# Patient Record
Sex: Female | Born: 1990 | Race: Asian | Hispanic: No | Marital: Married | Smoking: Never smoker
Health system: Southern US, Community
[De-identification: ages and names within clinical notes are randomized; demographics above are authoritative.]

## PROBLEM LIST (undated history)

## (undated) ENCOUNTER — Inpatient Hospital Stay (HOSPITAL_COMMUNITY): Payer: Self-pay

## (undated) DIAGNOSIS — Z789 Other specified health status: Secondary | ICD-10-CM

## (undated) HISTORY — PX: NO PAST SURGERIES: SHX2092

---

## 2015-01-26 ENCOUNTER — Encounter: Payer: Self-pay | Admitting: Obstetrics & Gynecology

## 2015-01-26 ENCOUNTER — Ambulatory Visit (INDEPENDENT_AMBULATORY_CARE_PROVIDER_SITE_OTHER): Payer: Self-pay

## 2015-01-26 DIAGNOSIS — Z3201 Encounter for pregnancy test, result positive: Secondary | ICD-10-CM

## 2015-01-26 LAB — POCT PREGNANCY, URINE: Preg Test, Ur: POSITIVE — AB

## 2015-01-26 NOTE — Progress Notes (Signed)
Pt here for pregnancy test.  Resulted +.  Pt received proof of pregnancy and will start prenatal care at the [facility].

## 2015-03-16 ENCOUNTER — Inpatient Hospital Stay (HOSPITAL_COMMUNITY)
Admission: AD | Admit: 2015-03-16 | Discharge: 2015-03-16 | Disposition: A | Discharge status: Home / Self Care | Payer: Self-pay | Source: Ambulatory Visit | Attending: Obstetrics & Gynecology | Admitting: Obstetrics & Gynecology

## 2015-03-16 ENCOUNTER — Encounter (HOSPITAL_COMMUNITY): Payer: Self-pay | Admitting: *Deleted

## 2015-03-16 DIAGNOSIS — O99019 Anemia complicating pregnancy, unspecified trimester: Secondary | ICD-10-CM

## 2015-03-16 DIAGNOSIS — O99011 Anemia complicating pregnancy, first trimester: Secondary | ICD-10-CM | POA: Insufficient documentation

## 2015-03-16 DIAGNOSIS — D649 Anemia, unspecified: Secondary | ICD-10-CM | POA: Insufficient documentation

## 2015-03-16 DIAGNOSIS — O99611 Diseases of the digestive system complicating pregnancy, first trimester: Secondary | ICD-10-CM | POA: Insufficient documentation

## 2015-03-16 DIAGNOSIS — Z3A12 12 weeks gestation of pregnancy: Secondary | ICD-10-CM | POA: Insufficient documentation

## 2015-03-16 DIAGNOSIS — K5900 Constipation, unspecified: Secondary | ICD-10-CM | POA: Insufficient documentation

## 2015-03-16 DIAGNOSIS — K219 Gastro-esophageal reflux disease without esophagitis: Secondary | ICD-10-CM | POA: Insufficient documentation

## 2015-03-16 HISTORY — DX: Other specified health status: Z78.9

## 2015-03-16 LAB — COMPREHENSIVE METABOLIC PANEL
ALBUMIN: 3.8 g/dL (ref 3.5–5.0)
ALT: 10 U/L — AB (ref 14–54)
ANION GAP: 4 — AB (ref 5–15)
AST: 17 U/L (ref 15–41)
Alkaline Phosphatase: 49 U/L (ref 38–126)
BUN: 9 mg/dL (ref 6–20)
CO2: 23 mmol/L (ref 22–32)
Calcium: 8.5 mg/dL — ABNORMAL LOW (ref 8.9–10.3)
Chloride: 107 mmol/L (ref 101–111)
Creatinine, Ser: 0.58 mg/dL (ref 0.44–1.00)
GFR calc Af Amer: >60 mL/min (ref >60)
GFR calc non Af Amer: >60 mL/min (ref >60)
GLUCOSE: 84 mg/dL (ref 65–99)
Potassium: 3.6 mmol/L (ref 3.5–5.1)
Sodium: 134 mmol/L — ABNORMAL LOW (ref 135–145)
TOTAL PROTEIN: 7 g/dL (ref 6.5–8.1)
Total Bilirubin: 0.5 mg/dL (ref 0.3–1.2)

## 2015-03-16 LAB — URINE MICROSCOPIC-ADD ON

## 2015-03-16 LAB — CBC
HCT: 31.8 % — ABNORMAL LOW (ref 36.0–46.0)
Hemoglobin: 10.2 g/dL — ABNORMAL LOW (ref 12.0–15.0)
MCH: 20.9 pg — ABNORMAL LOW (ref 26.0–34.0)
MCHC: 32.1 g/dL (ref 30.0–36.0)
MCV: 65 fL — AB (ref 78.0–100.0)
Platelets: 263 10*3/uL (ref 150–400)
RBC: 4.89 MIL/uL (ref 3.87–5.11)
RDW: 22.1 % — ABNORMAL HIGH (ref 11.5–15.5)
WBC: 8.7 10*3/uL (ref 4.0–10.5)

## 2015-03-16 LAB — URINALYSIS, ROUTINE W REFLEX MICROSCOPIC
BILIRUBIN URINE: NEGATIVE
GLUCOSE, UA: NEGATIVE mg/dL
Hgb urine dipstick: NEGATIVE
Ketones, ur: NEGATIVE mg/dL
Nitrite: NEGATIVE
PH: 6 (ref 5.0–8.0)
PROTEIN: NEGATIVE mg/dL
SPECIFIC GRAVITY, URINE: 1.015 (ref 1.005–1.030)
Urobilinogen, UA: 0.2 mg/dL (ref 0.0–1.0)

## 2015-03-16 LAB — AMYLASE: Amylase: 76 U/L (ref 28–100)

## 2015-03-16 LAB — LIPASE, BLOOD: Lipase: 13 U/L — ABNORMAL LOW (ref 22–51)

## 2015-03-16 MED ORDER — FAMOTIDINE 20 MG PO TABS
40.0000 mg | ORAL_TABLET | Freq: Once | ORAL | Status: AC
  Administered 2015-03-16: 40 mg via ORAL
  Filled 2015-03-16: qty 2

## 2015-03-16 NOTE — MAU Note (Signed)
Pain in rt upper quad.  Got worse yesterday, affected her ability to function.  Had had some tenderness prior.

## 2015-03-16 NOTE — MAU Note (Signed)
Going to [facility]

## 2015-03-16 NOTE — Discharge Instructions (Signed)
To bn (Constipation) To bn l khi m?t ng??i ?i ??i ti?n t h?n ba l?n trong m?t tu?n, ??i ti?n kh kh?n, ho?c ??i ti?n ra phn kh, c?ng, ho?c to h?n bnh th??ng. Khi tu?i cng cao th cng d? b? to bn. N?u qu v? c? g?ng ch?a to bn b?ng cc lo?i thu?c gip qu v? ??i ti?n ???c (thu?c nhu?n trng), b?nh c th? n?ng thm. S? d?ng thu?c nhu?n trng trong th?i gian di c th? lm cho cc c? c?a ru?t gi y?u ?i. Ch? ?? ?n thi?u ch?t x?, khng u?ng ?? n??c, v dng m?t s? lo?i thu?c nh?t ??nh c th? lm to bo n?ng thm.  NGUYN NHN.   M?t s? lo?i thu?c nh?t ??nh nh? thu?c ch?ng tr?m c?m, thu?c gi?m ?au, thu?c c b? sung s?t, thu?c trung ha axit d?ch v?, v thu?c l?i ti?u.  M?t s? b?nh l nh?t ??nh nh? ti?u ???ng, h?i ch?ng ru?t kch thch (IBS), b?nh c?a tuy?n gip, ho?c tr?m c?m.  Khng u?ng ?? n??c.  Khng ?n ?? th?c ?n giu ch?t x?.  C?ng th?ng ho?c do ?i l?i.  t ho?t ??ng thn th? ho?c th? d?c.  Nh?n ?i ??i ti?n.  S? d?ng qu nhi?u thu?c nhu?n trng. D?U HI?U V TRI?U CH?NG   ?i ??i ti?n t h?n ba l?n m?i tu?n.  Ph?i r?n m?nh ?? ??i ti?n.  ??i ti?n ra phn c?ng, kh, ho?c to h?n bnh th??ng.  C?m th?y ??y b?ng ho?c ch??ng b?ng.  ?au ? vng b?ng d??i.  Khng c?m th?y tho?i mi sau khi ??i ti?n. CH?N ?ON  Chuyn gia ch?m [place] s?c kh?e c?a qu v? s? h?i v? b?nh s? v khm th?c th? cho qu v?. C th? c?n ki?m tra thm trong tr??ng h?p to bn n?ng. M?t s? ki?m tra c th? bao g?m:  Ch?p X quang c ch?t c?n quang ?? ki?m tra tr?c trng, ??i trng, v ?i khi c? ru?t non c?a qu v?.  N?i soi tr?c trng sigma ?? ki?m tra ph?n pha d??i c?a ??i trng.  Th? thu?t soi ??i trng ?? khm ton b? ??i trng. ?I?U TR?  ?i?u tr? ty thu?c vo m?c ?? tr?m tr?ng c?a ch?ng to bn v nguyn nhn gy to bn. ?i?u tr? thng qua ch? ?? ?n u?ng bao g?m u?ng nhi?u n??c h?n v ?n th?c ?n c nhi?u ch?t x? h?n. ?i?u tr? thng qua l?i s?ng c th? bao g?m vi?c t?p th? d?c th??ng xuyn. N?u  nh?ng khuy?n ngh? v? ch? ?? ?n v l?i s?ng khng c tc d?ng, chuyn gia ch?m [place] s?c kh?e c th? khuy?n ngh? qu v? dng cc lo?i thu?c nhu?n trng khng c?n k ??n ?? gip qu v? ??i ti?n. C th? ph?i k ??n thu?c c?n k ??n n?u thu?c khng c?n k ??n khng c tc d?ng.  H??NG D?N CH?M [place] T?I NH   ?n th?c ?n c nhi?u ch?t x? nh? tri cy, rau, ng? c?c nguyn h?t, v cc lo?i ??u.  H?n ch? th?c ?n c nhi?u ch?t bo v ???ng ch? bi?n s?n, ch?ng h?n khoai ty chin, bnh hamburger, bnh quy, k?o, v soda.  C th? dng th?c ph?m ch?c n?ng c b? sung ch?t x? vo kh?u ph?n ?n c?a qu v? n?u qu v? khng th? dng ?? ch?t x? t? th?c ?n.  U?ng ?? n??c ?? gi? cho n??c ti?u trong ho?c vng nh?t.  T?p th? d?c th??ng xuyn ho?c  theo ch? d?n c?a chuyn gia ch?m [place] s?c kh?e.  Vo nh v? sinh ngay khi qu v? c nhu c?u. Khng nh?n ?i ??i ti?n.  Ch? s? d?ng thu?c khng c?n k ??n ho?c thu?c c?n k ??n theo ch? d?n c?a chuyn gia ch?m [place] s?c kh?e.Khng dng cc lo?i thu?c ch?ng to bn khc m khng bn b?c tr??c v?i chuyn gia ch?m [place] s?c kh?e. NGAY L?P T?C ?I KHM N?U:   ?i ??i ti?n ra mu ?? t??i.  Ch?ng to bn ko di h?n 4 ngy v tr?m tr?ng h?n.  Qu v? b? ?au b?ng ho?c ?au tr?c trng.  Phn c?a qu v? m?ng, trng nh? bt ch.  Qu v? b? s?t cn khng r nguyn nhn. ??M B?O QU V?:   Hi?u r cc h??ng d?n ny.  S? theo di tnh tr?ng c?a mnh.  S? yu c?u tr? gip ngay l?p t?c n?u qu v? c?m th?y khng kh?e ho?c th?y tr?m tr?ng h?n. Document Released: 11/20/2010 Document Revised: 08/10/2013 [facility] Patient Information 2015 [place], [place]. This information is not intended to replace advice given to you by your health care provider. Make sure you discuss any questions you have with your health care provider.  ? nng trong khi mang thai  (Heartburn During Pregnancy ) ? nng l m?t c?m gic nng rt ? ng?c do axit d? dy tro ng??c ln th?c qu?n gy ra. ? nng ph? bi?n trong th?i k?  mang thai v m?t hocmon ? bi?t (progesterone) ???c gi?i phng ra khi ph? n? mang [place]. Hc mn progesterone c th? lm l?ng van ng?n cch th?c qu?n v?i d? dy. ?i?u ny lm cho axit ?i ng??c ln th?c qu?n, gy ? nng. ? nng c?ng c th? x?y ra trong khi mang [place] do t? cung to ra, ??y ng??c ln d? dy, ??y nhi?u axit h?n vo th?c qu?n. ?i?u ny ??c bi?t ?ng trong giai ?o?n cu?i c?a [place] k?. V?n ?? ? nng th??ng bi?n m?t sau khi sinh. NGUYN NHN.  ? nng do axit d? dy tro ng??c ln th?c qu?n gy ra. Trong khi mang thai, vi?c ny c th? do nhi?u nguyn nhn khc nhau, bao g?m:   Hc mn progesterone.  Thay ??i n?ng ?? hc mn.  T? cung pht tri?n, ??y axit d? dy ng??c ln.  B?a ?n no.  M?t s? th?c ph?m v ?? u?ng nh?t ??nh.  T?p luy?n.  T?ng s?n sinh axit. D?U HI?U V TRI?U CH?NG   ?au rt ? ng?c ho?c h?ng d??i.  C?m gic ??ng trong mi?ng.  Ho. CH?N ?ON  Chuyn gia ch?m [place] s?c kh?e s? th??ng ch?n ?on ? nng b?ng cch h?i k? ti?n s? cc v?n ?? lin quan c?a qu vi. C th? c?n xt nghi?m mu ?? ki?m tra m?t lo?i vi khu?n nh?t ??nh c lin quan t?i ? nng. ?i khi, ? nng ???c ch?n ?on b?ng k ??n dng thu?c ?i?u tr? ? nng ?? xem tri?u ch?ng c ???c c?i thi?n khng. Trong m?t s? tr??ng h?p c th? ph?i lm m?t th? thu?t c tn l n?i soi. Trong th? thu?t ny, m?t ?ng c ?n v m?t my ?nh ? ??u (?n n?i soi) ???c s? d?ng ?? ki?m tra th?c qu?n v d? dy. ?I?U TR?  Vi?c ?i?u tr? s? khc nhau ty thu?c vo m?c ?? n?ng c?a cc tri?u ch?ng. Chuyn gia ch?m [place] s?c kh?e c?a qu v? c th? khuy?n ngh?:  S?  d?ng m?t s? thu?c khng c?n k ??n (thu?c trung ha axit d?ch v?, thu?c lm gi?m axit d?ch v?) ?? ?i?u tr? ? nng m?c ?? nh?.  Cc lo?i thu?c k ??n lm gi?m axit d? dy ho?c ?? b?o v? nim m?c d? dy.  M?t s? thay ??i nh?t ??nh trong ch? ?? ?n c?a qu v?.  Nng cao ??u gi??ng c?a qu v? b?ng cch k cc c?c g?ch d??i chn gi??ng. Vi?c ny gip ng?n ng?a a xt trong d? dy khng  tro ng??c ln th?c qu?n khi qu v? n?m. H??NG D?N CH?M [place] T?I NH   Ch? s? d?ng thu?c khng c?n k ??n ho?c thu?c c?n k ??n theo ch? d?n c?a chuyn gia ch?m [place] s?c kh?e.  Nng cao ??u gi??ng c?a qu v? b?ng cch k cc c?c g?ch d??i chn gi??ng n?u chuyn gia ch?m  s?c kh?e yu c?u lm nh? v?y. Dng nhi?u g?i h?n khi ng? l khng hi?u qu? v ?i?u ? ch? lm thay ??i t? th? ??u c?a qu v?.  Khngt?p th? d?c ngay sau khi ?n.  Trnh ?n tr??c khi ?i ng? 2-3 ti?ng. Khng n?m ngay sau khi ?n.  ?n nhi?u b?a nh? trong ngy thay v ?n 3 b?a no.  Xc ??nh cc lo?i th?c ph?m v ?? u?ng lm cho cc tri?u ch?ng t?i t? h?n v trnh dng chng. Cc lo?i th?c ph?m qu v? c th? mu?n trnh dng bao g?m:  H?t tiu.  S c la.  Th?c ?n nhi?u ch?t bo, bao g?m th?c ?n chin, rn.  Th?c ?n [place].  T?i v hnh.  Cc lo?i qu? thu?c gi?ng cam qut, g?m cam, b??i, chanh, v chanh l cam.  Th?c ph?m ch?a c chua ho?c cc s?n ph?m lm t? c chua.  B?c h.  ?? u?ng c ga v caffein.  Gi?m. ?I KHM N?U:  Qu v? b? b?t k? ki?u ?au b?ng no.  Qu v? c?m th?y nng ? b?ng trn ho?c ng?c, ??c bi?t l sau khi ?n ho?c n?m xu?ng.  Qu v? b? bu?n nn v nn m?a.  Qu v? c?m th?y kh ch?u trong d? dy sau khi ?n. NGAY L?P T?C ?I KHM N?U:   Qu v? b? ?au ng?c n?ng lan xu?ng cnh tay ho?c ra hm ho?c c?.  Qu v? c c?m gic ?? m? hi, hoa m?t hay chng m?t.  Qu v? th?y kh th?.  Qu v? nn ra mu.  Qu v? kh nu?t ho?c b? ?au khi nu?t.  Phn c?a qu v? c mu ho?c c mu ?en nh? h?c n.  Qu v? c nh?ng c?n ? nng nhi?u h?n 3 l?n m?i tu?n, trong h?n 2 tu?n. ??M B?O QU V?:  Hi?u r cc h??ng d?n ny.  S? theo di tnh tr?ng c?a mnh.  S? yu c?u tr? gip ngay l?p t?c n?u qu v? c?m th?y khng kh?e ho?c th?y tr?m tr?ng h?n. Document Released: 08/05/2005 Document Revised: 05/26/2013 [facility] Patient Information 2015 [place], [place]. This information is not intended to replace advice  given to you by your health care provider. Make sure you discuss any questions you have with your health care provider.  Thi?u mu do thi?u s?t (Iron Deficiency Anemia) Thi?u mu l m?t tnh tr?ng b?nh l c s? l??ng h?ng c?u ho?c hemoglobin trong mu t h?n bnh th??ng. Hemoglobin l m?t ph?n c?a h?ng c?u mang -xy. Thi?u mu do thi?u s?t l thi?u mu do l??ng s?t  qu t. ?y l lo?i thi?u mu ph? bi?n nh?t. Thi?u mu do thi?u s?t c th? khi?n qu v? m?t m?i v kh th?. NGUYN NHN.   Thi?u s?t trong ch? ?? ?n u?ng.  Kh? n?ng h?p th? s?t km, nh? th?y trong cc r?i lo?n ???ng ru?t.  Ch?y mu ???ng ru?t.  K? kinh nguy?t n?ng. D?U HI?U V TRI?U CH?NG  Thi?u mu nh? c th? khng ???c bi?t ??n. Tri?u ch?ng c th? bao g?m:  M?t m?i.  ?au ??u.  Da nh?t nh?t.  Y?u.  M?t m?i.  Kh th?.  Chng m?t.  L?nh tay v chn.  Nh?p tim nhanh ho?c khng ??u. CH?N ?ON  Ch?n ?on c?n ph?i c chuyn gia ch?m [place] s?c kh?e ?nh gi k? v khm th?c th?Vanessa [place] nghi?m mu th??ng ???c dng ?? xc ??nh thi?u mu do thi?u s?t. Cc xt nghi?m b? sung c th? ???c th?c hi?n ?? tm ra nguyn nhn bn trong c?a tnh tr?ng thi?u mu. Nh?ng xt nghi?m ny c th? bao g?m:  Xt nghi?m tm mu trong phn (xt nghi?m mu ?n trong phn).  M?t th? thu?t ?? xem bn trong ??i trng v tr?c trng (soi ??i trng)  M?t th? thu?t ?? xem bn trong th?c qu?n v d? dy (n?i soi). ?I?U TR?  Thi?u mu do thi?u s?t ???c ?i?u tr? b?ng cch kh?c ph?c nguyn nhn gy thi?u s?t. Vi?c ?i?u tr? c th? bao g?m:  B? sung th?c ?n giu ch?t s?t vo ch? ?? ?n c?a qu v?.  Dng th?c ph?m b? sung s?t. Ph? n? mang thai ho?c cho con b s? c?n b? sung s?t, v ch? ?? ?n u?ng bnh th??ng c?a h? th??ng khng cung c?p ?? s? l??ng yu c?u.  Dng vitamin Vitamin C gip c?i thi?n kh? n?ng h?p thu s?t. Chuyn gia ch?m [place] s?c kh?e c th? [place] qu v? nn u?ng vin s?t v?i m?t ly n??c cam ho?c th?c ph?m ch?c n?ng c b? sung vitamin C.  Cc  lo?i thu?c lm kinh nguy?t ra t mu h?n.  Ph?u thu?t. H??NG D?N CH?M [place] T?I NH   U?ng s?t theo ch? d?n c?a chuyn gia ch?m [place] s?c kh?e.  N?u qu v? khng th? dng th?c ph?m ch?c n?ng c b? sung s?t theo ???ng u?ng, hy ni v?i chuyn gia ch?m [place] s?c kh?e v? vi?c dng thu?c theo ???ng t?nh m?ch (trong t?nh m?ch) ho?c tim vo c?.  ?? h?p thu s?t t?t nh?t, nn s? d?ng cc th?c ph?m ch?c n?ng c b? sung s?t khi ?i. N?u qu v? khng th? dung n?p ???c nh?ng ch?t ny khi b?ng ?i, qu v? c th? c?n s? d?ng chng cng v?i th?c ?n.  Khng u?ng s?a ho?c thu?c lm gi?m a-xt trong d? dy cng m?t lc v?i th?c ph?m ch?c n?ng c b? sung s?t. S?a v thu?c lm gi?m a-xt c th? ?nh h??ng ??n kh? n?ng h?p thu s?t.  Th?c ph?m ch?c n?ng c b? sung s?t c th? gy to bn. B?o ??m vi?c s? d?ng ch?t x? trong ch? ?? ?n ?? trnh to bn. M?t thu?c lm m?m phn c?ng c th? ???c khuyn dng.  U?ng vitamin theo ch? d?n c?a chuyn gia ch?m [place] s?c kh?e.  p d?ng ch? ?? ?n u?ng giu ch?t s?t. Cc lo?i th?c ?n giu ch?t s?t bao g?m gan, th?t b n?c, bnh m nguyn h?t, tr?ng, tri cy kh v rau c mu xanh  s?m, rau c nhi?u l. NGAY L?P T?C ?I KHM N?U:   Qu v? b? ng?t. N?u tnh tr?ng ny x?y ra, Khng li xe. G?i cho d?ch v? c?p c?u t?i ??a ph??ng (911 ? [place]?) n?u khng c s? tr? gip khc.  Qu v? b? ?au ng?c.  Qu v? c?m th?y bu?n nn ho?c nn.  Qu v? b? kh th? n?ng ho?c gia t?ng cng v?i ho?t ??ng.  Qu v? c?m th?y y?u ?t.  Qu v? c nh?p tim nhanh.  Qu v? b? ?? m? hi khng r nguyn nhn.  Qu v? th?y chong vng khi ra kh?i gh? ho?c gi??ng. ??M B?O QU V?:   Hi?u r cc h??ng d?n ny.  S? theo di tnh tr?ng c?a mnh.  S? yu c?u tr? gip ngay l?p t?c n?u qu v? c?m th?y khng kh?e ho?c th?y tr?m tr?ng h?n. Document Released: 08/05/2005 Document Revised: 08/10/2013 [facility] Patient Information 2015 [place], [place]. This information is not intended to replace advice given to you by  your health care provider. Make sure you discuss any questions you have with your health care provider.

## 2015-03-16 NOTE — MAU Provider Note (Signed)
History     CSN: [id number]  Arrival date and time: 03/16/15 8242   First Provider Initiated Contact with Patient 03/16/15 1050      Chief Complaint  Patient presents with  . Abdominal Pain   HPI   Ms. Wendy Gay is a 24 y.o. female G1P0 at 8w4dpresenting to MAU with abdominal pain. The pain started yesterday; the pain is located in in the middle right upper quadrant of her abdomen. The pain comes and goes. She has not taken anything for the pain. Nothing makes the pain worse, however curling up on the couch makes the pain better.   The pain is sharp. She is eating and drinking normally; however when the pain comes she is unable to do anything.   Denies vaginal bleeding.   OB History    Gravida Para Term Preterm AB TAB SAB Ectopic Multiple Living   1         0      Past Medical History  Diagnosis Date  . Medical history non-contributory     Past Surgical History  Procedure Laterality Date  . No past surgeries      History reviewed. No pertinent family history.  History  Substance Use Topics  . Smoking status: Never Smoker   . Smokeless tobacco: Not on file  . Alcohol Use: No    Allergies: No Known Allergies  Prescriptions prior to admission  Medication Sig Dispense Refill Last Dose  . Prenatal Vit-Fe Fumarate-FA (PRENATAL MULTIVITAMIN) TABS tablet Take 1 tablet by mouth daily at 12 noon.   03/15/2015 at Unknown time   Results for orders placed or performed during the hospital encounter of 03/16/15 (from the past 48 hour(s))  Urinalysis, [facility] w reflex [facility]     Status: Abnormal   Collection Time: 03/16/15 10:15 AM  Result Value Ref Range   Color, Urine YELLOW YELLOW   APPearance HAZY (A) CLEAR   Specific Gravity, Urine 1.015 1.005 - 1.030   pH 6.0 5.0 - 8.0   Glucose, UA NEGATIVE NEGATIVE mg/dL   Hgb urine dipstick NEGATIVE NEGATIVE   Bilirubin Urine NEGATIVE NEGATIVE   Ketones, ur NEGATIVE NEGATIVE mg/dL   Protein, ur NEGATIVE  NEGATIVE mg/dL   Urobilinogen, UA 0.2 0.0 - 1.0 mg/dL   Nitrite NEGATIVE NEGATIVE   Leukocytes, UA SMALL (A) NEGATIVE  Urine microscopic-add on     Status: Abnormal   Collection Time: 03/16/15 10:15 AM  Result Value Ref Range   Squamous Epithelial / LPF FEW (A) RARE   WBC, UA 0-2 <3 WBC/hpf   Bacteria, UA FEW (A) RARE  CBC     Status: Abnormal   Collection Time: 03/16/15 11:04 AM  Result Value Ref Range   WBC 8.7 4.0 - 10.5 K/uL   RBC 4.89 3.87 - 5.11 MIL/uL   Hemoglobin 10.2 (L) 12.0 - 15.0 g/dL   HCT 31.8 (L) 36.0 - 46.0 %   MCV 65.0 (L) 78.0 - 100.0 fL   MCH 20.9 (L) 26.0 - 34.0 pg   MCHC 32.1 30.0 - 36.0 g/dL   RDW 22.1 (H) 11.5 - 15.5 %   Platelets 263 150 - 400 K/uL  Comprehensive metabolic panel     Status: Abnormal   Collection Time: 03/16/15 11:04 AM  Result Value Ref Range   Sodium 134 (L) 135 - 145 mmol/L   Potassium 3.6 3.5 - 5.1 mmol/L   Chloride 107 101 - 111 mmol/L   CO2 23 22 -  32 mmol/L   Glucose, Bld 84 65 - 99 mg/dL   BUN 9 6 - 20 mg/dL   Creatinine, Ser 0.58 0.44 - 1.00 mg/dL   Calcium 8.5 (L) 8.9 - 10.3 mg/dL   Total Protein 7.0 6.5 - 8.1 g/dL   Albumin 3.8 3.5 - 5.0 g/dL   AST 17 15 - 41 U/L   ALT 10 (L) 14 - 54 U/L   Alkaline Phosphatase 49 38 - 126 U/L   Total Bilirubin 0.5 0.3 - 1.2 mg/dL   GFR calc non Af Amer >60 >60 mL/min   GFR calc Af Amer >60 >60 mL/min    Comment: (NOTE) The eGFR has been calculated using the CKD EPI equation. This calculation has not been validated in all clinical situations. eGFR's persistently <60 mL/min signify possible Chronic Kidney Disease.    Anion gap 4 (L) 5 - 15  Amylase     Status: None   Collection Time: 03/16/15 11:04 AM  Result Value Ref Range   Amylase 76 28 - 100 U/L  Lipase, blood     Status: Abnormal   Collection Time: 03/16/15 11:04 AM  Result Value Ref Range   Lipase 13 (L) 22 - 51 U/L    Review of Systems  Constitutional: Negative for fever.  Gastrointestinal: Positive for nausea,  abdominal pain and constipation (Hard stools; last normal BM was monday. ). Negative for heartburn, vomiting and diarrhea.  Genitourinary: Negative for dysuria.   Physical Exam   Blood pressure 119/74, pulse 88, temperature 98.9 F (37.2 C), temperature source Oral, resp. rate 16, height 5' 1.25" (1.556 m), weight 51.71 kg (114 lb), last menstrual period 12/18/2014.  Physical Exam  Constitutional: She is oriented to person, place, and time. Vital signs are normal. She appears well-developed and well-nourished.  Non-toxic appearance. She does not have a sickly appearance. She does not appear ill. No distress.  GI: Soft. She exhibits no distension and no mass. There is no tenderness. There is no rebound and no guarding.  Musculoskeletal: Normal range of motion.  Neurological: She is alert and oriented to person, place, and time.  Skin: Skin is warm. She is not diaphoretic.  Psychiatric: Her behavior is normal.    MAU Course  Procedures  MDM  UA  Pepcid 2 tabs given Amylase Lipase CBC CMET  Pain 0/10 at the time of discharge; pepcid relieved her pain   Assessment and Plan   A:  1. Gastroesophageal reflux disease, esophagitis presence not specified   2. Constipation during pregnancy in first trimester   3. Anemia affecting pregnancy    P;  Discharge home in stable condition Ok to take tums/pepcid over the counter as needed, as directed on the bottle Safe med list given with options to use for constipation Over the counter iron as directed on the bottle; to start after constipation is resolved  Return to MAU if symptoms worsen; patient is stable at the time of discharge, labs normal. If persists she may need gall bladder scan.   Lezlie Lye, NP 03/16/2015 10:54 AM

## 2015-03-23 ENCOUNTER — Emergency Department (INDEPENDENT_AMBULATORY_CARE_PROVIDER_SITE_OTHER)
Admission: EM | Admit: 2015-03-23 | Discharge: 2015-03-23 | Disposition: A | Discharge status: Home / Self Care | Payer: Self-pay | Source: Home / Self Care | Attending: Emergency Medicine | Admitting: Emergency Medicine

## 2015-03-23 ENCOUNTER — Encounter (HOSPITAL_COMMUNITY): Payer: Self-pay | Admitting: Emergency Medicine

## 2015-03-23 DIAGNOSIS — R0989 Other specified symptoms and signs involving the circulatory and respiratory systems: Secondary | ICD-10-CM

## 2015-03-23 DIAGNOSIS — R591 Generalized enlarged lymph nodes: Secondary | ICD-10-CM

## 2015-03-23 MED ORDER — AMOXICILLIN 500 MG PO CAPS: 500.0000 mg | ORAL_CAPSULE | Freq: Two times a day (BID) | ORAL | Status: DC

## 2015-03-23 NOTE — Discharge Instructions (Signed)
The bump is a lymph node. They become tender and swollen when there is an infection. Take amoxicillin twice a day for 10 days. This will take weeks to months to go away, even after treating the infection.

## 2015-03-23 NOTE — ED Provider Notes (Signed)
CSN: [id number]     Arrival date & time 03/23/15  1622 History   First MD Initiated Contact with Patient 03/23/15 1830     Chief Complaint  Patient presents with  . Mass   (Consider location/radiation/quality/duration/timing/severity/associated sxs/prior Treatment) HPI She is a 24 year old woman here for evaluation of lump in her neck. She is approximately [redacted] weeks pregnant. She states she noticed a tender lump under her chin a few days ago. No fevers or chills. No sore throat or dental issues. She wanted it evaluated as she is pregnant.  Past Medical History  Diagnosis Date  . Medical history non-contributory    Past Surgical History  Procedure Laterality Date  . No past surgeries     No family history on file. History  Substance Use Topics  . Smoking status: Never Smoker   . Smokeless tobacco: Not on file  . Alcohol Use: No   OB History    Gravida Para Term Preterm AB TAB SAB Ectopic Multiple Living   1         0     Review of Systems As in history of present illness Allergies  Review of patient's allergies indicates no known allergies.  Home Medications   Prior to Admission medications   Medication Sig Start Date End Date Taking? Authorizing Provider  amoxicillin (AMOXIL) 500 MG capsule Take 1 capsule (500 mg total) by mouth 2 (two) times daily. 03/23/15   Charm Rings, MD  Prenatal Vit-Fe Fumarate-FA (PRENATAL MULTIVITAMIN) TABS tablet Take 1 tablet by mouth daily at 12 noon.    Historical Provider, MD   BP 116/72 mmHg  Pulse 75  Temp(Src) 98 F (36.7 C) (Oral)  Resp 18  SpO2 100%  LMP 12/18/2014 (Exact Date) Physical Exam  Constitutional: She is oriented to person, place, and time. She appears well-developed and well-nourished. No distress.  HENT:  Mouth/Throat: Oropharynx is clear and moist.  She has generally poor dentition. No obvious abscess or erythema. She does have a 5-10 mm tender mobile mass under her chin.  Neck: Neck supple.  Cardiovascular: Normal  rate.   Pulmonary/Chest: Effort normal.  Neurological: She is alert and oriented to person, place, and time.    ED Course  Procedures (including critical care time) Labs Review Labs Reviewed - No data to display  Imaging Review No results found.   MDM   1. Tender lymph node    This is likely a lymph node. With her poor dentition, will treat with 10 days of amoxicillin. Follow-up as needed.    Charm Rings, MD 03/23/15 1900

## 2015-04-03 ENCOUNTER — Other Ambulatory Visit (HOSPITAL_COMMUNITY): Payer: Self-pay | Admitting: Nurse Practitioner

## 2015-04-03 DIAGNOSIS — Z3689 Encounter for other specified antenatal screening: Secondary | ICD-10-CM

## 2015-04-03 DIAGNOSIS — Z3A19 19 weeks gestation of pregnancy: Secondary | ICD-10-CM

## 2015-04-03 LAB — OB RESULTS CONSOLE HIV ANTIBODY (ROUTINE TESTING): HIV: NONREACTIVE

## 2015-04-03 LAB — OB RESULTS CONSOLE ABO/RH: RH Type: POSITIVE

## 2015-04-03 LAB — OB RESULTS CONSOLE RPR: RPR: NONREACTIVE

## 2015-04-03 LAB — OB RESULTS CONSOLE RUBELLA ANTIBODY, IGM: Rubella: IMMUNE

## 2015-04-03 LAB — OB RESULTS CONSOLE HEPATITIS B SURFACE ANTIGEN: HEP B S AG: NEGATIVE

## 2015-04-03 LAB — OB RESULTS CONSOLE ANTIBODY SCREEN: Antibody Screen: NEGATIVE

## 2015-04-03 LAB — OB RESULTS CONSOLE GC/CHLAMYDIA
CHLAMYDIA, DNA PROBE: POSITIVE
Gonorrhea: NEGATIVE

## 2015-05-01 LAB — OB RESULTS CONSOLE GC/CHLAMYDIA
CHLAMYDIA, DNA PROBE: NEGATIVE
Gonorrhea: NEGATIVE

## 2015-05-05 ENCOUNTER — Other Ambulatory Visit (HOSPITAL_COMMUNITY): Payer: Self-pay | Admitting: Nurse Practitioner

## 2015-05-05 DIAGNOSIS — Z3689 Encounter for other specified antenatal screening: Secondary | ICD-10-CM

## 2015-05-05 DIAGNOSIS — Z3A20 20 weeks gestation of pregnancy: Secondary | ICD-10-CM

## 2015-05-08 ENCOUNTER — Ambulatory Visit (HOSPITAL_COMMUNITY)
Admission: RE | Admit: 2015-05-08 | Discharge: 2015-05-08 | Disposition: A | Discharge status: Home / Self Care | Payer: Medicaid Other | Source: Ambulatory Visit | Attending: Nurse Practitioner | Admitting: Nurse Practitioner

## 2015-05-08 DIAGNOSIS — Z3A20 20 weeks gestation of pregnancy: Secondary | ICD-10-CM | POA: Insufficient documentation

## 2015-05-08 DIAGNOSIS — Z36 Encounter for antenatal screening of mother: Secondary | ICD-10-CM | POA: Diagnosis present

## 2015-05-08 DIAGNOSIS — Z3689 Encounter for other specified antenatal screening: Secondary | ICD-10-CM

## 2015-08-20 NOTE — L&D Delivery Note (Signed)
Delivery Note Pt pushed well and at 11:31 PM a viable female was delivered via Vaginal, Spontaneous Delivery (Presentation: Right Occiput Anterior).  APGAR: 9, 9; weight: pending . Nuchal cord x 2 fairly tight; delivered thru using 'somersault maneuver.' Infant dried and placed on pt's abd. Cord clamped and cut by mother of pt. Hospital cord blood sample collected.  Placenta status: Intact, Spontaneous.  Cord: 3 vessels and subjectively long and thin  Anesthesia: 1% lidocaine Episiotomy: None Lacerations: 1st degree perineal Suture: 3.0 Vicryl Est. Blood Loss (mL): 75  Mom to postpartum.  Baby to Couplet care / Skin to Skin.  Cam Hai CNM 09/16/2015, 12:09 AM

## 2015-09-15 ENCOUNTER — Inpatient Hospital Stay (HOSPITAL_COMMUNITY)
Admission: AD | Admit: 2015-09-15 | Discharge: 2015-09-17 | DRG: 775 | Disposition: A | Discharge status: Home / Self Care | Payer: Medicaid Other | Source: Ambulatory Visit | Attending: Family Medicine | Admitting: Family Medicine

## 2015-09-15 ENCOUNTER — Encounter (HOSPITAL_COMMUNITY): Payer: Self-pay | Admitting: *Deleted

## 2015-09-15 DIAGNOSIS — Z3A36 36 weeks gestation of pregnancy: Secondary | ICD-10-CM

## 2015-09-15 DIAGNOSIS — IMO0001 Reserved for inherently not codable concepts without codable children: Secondary | ICD-10-CM

## 2015-09-15 LAB — TYPE AND SCREEN
ABO/RH(D): O POS
ANTIBODY SCREEN: NEGATIVE

## 2015-09-15 LAB — CBC
HEMATOCRIT: 32.7 % — AB (ref 36.0–46.0)
Hemoglobin: 10.5 g/dL — ABNORMAL LOW (ref 12.0–15.0)
MCH: 22.5 pg — ABNORMAL LOW (ref 26.0–34.0)
MCHC: 32.1 g/dL (ref 30.0–36.0)
MCV: 70 fL — ABNORMAL LOW (ref 78.0–100.0)
Platelets: 278 10*3/uL (ref 150–400)
RBC: 4.67 MIL/uL (ref 3.87–5.11)
RDW: 15 % (ref 11.5–15.5)
WBC: 15.4 10*3/uL — AB (ref 4.0–10.5)

## 2015-09-15 LAB — RAPID HIV SCREEN (HIV 1/2 AB+AG)
HIV 1/2 Antibodies: NONREACTIVE
HIV-1 P24 ANTIGEN - HIV24: NONREACTIVE

## 2015-09-15 MED ORDER — OXYTOCIN 10 UNIT/ML IJ SOLN
2.5000 [IU]/h | INTRAMUSCULAR | Status: DC
  Filled 2015-09-15: qty 4

## 2015-09-15 MED ORDER — ACETAMINOPHEN 325 MG PO TABS: 650.0000 mg | ORAL_TABLET | ORAL | Status: DC | PRN

## 2015-09-15 MED ORDER — OXYCODONE-ACETAMINOPHEN 5-325 MG PO TABS
1.0000 | ORAL_TABLET | ORAL | Status: DC | PRN
Start: 2015-09-15 — End: 2015-09-16

## 2015-09-15 MED ORDER — LIDOCAINE HCL (PF) 1 % IJ SOLN
30.0000 mL | INTRAMUSCULAR | Status: DC | PRN
  Administered 2015-09-15: 30 mL via SUBCUTANEOUS
  Filled 2015-09-15: qty 30

## 2015-09-15 MED ORDER — OXYCODONE-ACETAMINOPHEN 5-325 MG PO TABS: 2.0000 | ORAL_TABLET | ORAL | Status: DC | PRN

## 2015-09-15 MED ORDER — FENTANYL CITRATE (PF) 100 MCG/2ML IJ SOLN: 100.0000 ug | INTRAMUSCULAR | Status: DC | PRN

## 2015-09-15 MED ORDER — BETAMETHASONE SOD PHOS & ACET 6 (3-3) MG/ML IJ SUSP
12.0000 mg | Freq: Once | INTRAMUSCULAR | Status: AC
  Administered 2015-09-15: 12 mg via INTRAMUSCULAR
  Filled 2015-09-15: qty 2

## 2015-09-15 MED ORDER — CITRIC ACID-SODIUM CITRATE 334-500 MG/5ML PO SOLN: 30.0000 mL | ORAL | Status: DC | PRN

## 2015-09-15 MED ORDER — OXYTOCIN 10 UNIT/ML IJ SOLN
INTRAMUSCULAR | Status: AC
  Administered 2015-09-15: 10 [IU] via INTRAMUSCULAR
  Filled 2015-09-15: qty 1

## 2015-09-15 MED ORDER — ONDANSETRON HCL 4 MG/2ML IJ SOLN: 4.0000 mg | Freq: Four times a day (QID) | INTRAMUSCULAR | Status: DC | PRN

## 2015-09-15 MED ORDER — OXYTOCIN BOLUS FROM INFUSION: 500.0000 mL | INTRAVENOUS | Status: DC

## 2015-09-15 MED ORDER — LACTATED RINGERS IV SOLN: 500.0000 mL | INTRAVENOUS | Status: DC | PRN

## 2015-09-15 MED ORDER — SODIUM CHLORIDE 0.9 % IV SOLN
2.0000 g | Freq: Once | INTRAVENOUS | Status: AC
  Administered 2015-09-15: 2 g via INTRAVENOUS
  Filled 2015-09-15: qty 2000

## 2015-09-15 MED ORDER — LACTATED RINGERS IV SOLN
INTRAVENOUS | Status: DC
  Administered 2015-09-15: 23:00:00 via INTRAVENOUS

## 2015-09-15 NOTE — H&P (Signed)
Wendy Gay is a 25 y.o. female G1P0 @ 36.3wks by 18wk scan presenting for active preterm labor and SROM. Her preg has been followed by the [facility] and has been remarkable for 1) +chlam in preg w/ neg TOC 2) GBS unknown  History OB History    Gravida Para Term Preterm AB TAB SAB Ectopic Multiple Living   1         0     Past Medical History  Diagnosis Date  . Medical history non-contributory    Past Surgical History  Procedure Laterality Date  . No past surgeries     Family History: family history is not on file. Social History:  reports that she has never smoked. She does not have any smokeless tobacco history on file. She reports that she does not drink alcohol or use illicit drugs.   Prenatal Transfer Tool  Maternal Diabetes: No Genetic Screening: Normal Maternal Ultrasounds/Referrals: Normal Fetal Ultrasounds or other Referrals:  None Maternal Substance Abuse:  No Significant Maternal Medications:  None Significant Maternal Lab Results:  Lab values include: Other:  GBS unknown Other Comments:  None  ROS  Dilation: 7 Effacement (%): 100 Exam by:: BENJI, RN Last menstrual period 12/18/2014. Exam Physical Exam  Prenatal labs: ABO, Rh: O/Positive/-- (08/15 0000) Antibody: Negative (08/15 0000) Rubella: Immune (08/15 0000) RPR: Nonreactive (08/15 0000)  HBsAg: Negative (08/15 0000)  HIV: Non-reactive (08/15 0000)  GBS:     Assessment/Plan: IUP@36 .3wks Active preterm labor SROM GBS unk  Admit to [facility] Expectant management Amp for GBS ppx BMZ Anticipate SVD   Aydn Ferrara CNM 09/15/2015, 10:38 PM

## 2015-09-15 NOTE — MAU Note (Signed)
PT THINKS  SROM  AT  1015PM.    PNC  AT  HD.    NO VE IN HD

## 2015-09-16 ENCOUNTER — Encounter (HOSPITAL_COMMUNITY): Payer: Self-pay

## 2015-09-16 DIAGNOSIS — Z3A36 36 weeks gestation of pregnancy: Secondary | ICD-10-CM

## 2015-09-16 LAB — RPR: RPR Ser Ql: NONREACTIVE

## 2015-09-16 LAB — ABO/RH: ABO/RH(D): O POS

## 2015-09-16 MED ORDER — WITCH HAZEL-GLYCERIN EX PADS: 1.0000 "application " | MEDICATED_PAD | CUTANEOUS | Status: DC | PRN

## 2015-09-16 MED ORDER — OXYTOCIN 10 UNIT/ML IJ SOLN
10.0000 [IU] | Freq: Once | INTRAMUSCULAR | Status: AC
  Administered 2015-09-15: 10 [IU] via INTRAMUSCULAR

## 2015-09-16 MED ORDER — ZOLPIDEM TARTRATE 5 MG PO TABS: 5.0000 mg | ORAL_TABLET | Freq: Every evening | ORAL | Status: DC | PRN

## 2015-09-16 MED ORDER — OXYCODONE-ACETAMINOPHEN 5-325 MG PO TABS: 1.0000 | ORAL_TABLET | ORAL | Status: DC | PRN

## 2015-09-16 MED ORDER — TETANUS-DIPHTH-ACELL PERTUSSIS 5-2.5-18.5 LF-MCG/0.5 IM SUSP
0.5000 mL | Freq: Once | INTRAMUSCULAR | Status: AC
  Administered 2015-09-17: 0.5 mL via INTRAMUSCULAR
  Filled 2015-09-16: qty 0.5

## 2015-09-16 MED ORDER — INFLUENZA VAC SPLIT QUAD 0.5 ML IM SUSY: 0.5000 mL | PREFILLED_SYRINGE | INTRAMUSCULAR | Status: DC

## 2015-09-16 MED ORDER — ONDANSETRON HCL 4 MG/2ML IJ SOLN: 4.0000 mg | INTRAMUSCULAR | Status: DC | PRN

## 2015-09-16 MED ORDER — ACETAMINOPHEN 325 MG PO TABS
650.0000 mg | ORAL_TABLET | ORAL | Status: DC | PRN
  Administered 2015-09-16: 650 mg via ORAL
  Filled 2015-09-16: qty 2

## 2015-09-16 MED ORDER — DIBUCAINE 1 % RE OINT: 1.0000 "application " | TOPICAL_OINTMENT | RECTAL | Status: DC | PRN

## 2015-09-16 MED ORDER — ONDANSETRON HCL 4 MG PO TABS: 4.0000 mg | ORAL_TABLET | ORAL | Status: DC | PRN

## 2015-09-16 MED ORDER — SIMETHICONE 80 MG PO CHEW: 80.0000 mg | CHEWABLE_TABLET | ORAL | Status: DC | PRN

## 2015-09-16 MED ORDER — PRENATAL MULTIVITAMIN CH
1.0000 | ORAL_TABLET | Freq: Every day | ORAL | Status: DC
  Administered 2015-09-16 – 2015-09-17 (×2): 1 via ORAL
  Filled 2015-09-16 (×2): qty 1

## 2015-09-16 MED ORDER — LANOLIN HYDROUS EX OINT: TOPICAL_OINTMENT | CUTANEOUS | Status: DC | PRN

## 2015-09-16 MED ORDER — SENNOSIDES-DOCUSATE SODIUM 8.6-50 MG PO TABS
2.0000 | ORAL_TABLET | ORAL | Status: DC
  Administered 2015-09-17: 2 via ORAL
  Filled 2015-09-16: qty 2

## 2015-09-16 MED ORDER — DIPHENHYDRAMINE HCL 25 MG PO CAPS: 25.0000 mg | ORAL_CAPSULE | Freq: Four times a day (QID) | ORAL | Status: DC | PRN

## 2015-09-16 MED ORDER — BENZOCAINE-MENTHOL 20-0.5 % EX AERO
1.0000 "application " | INHALATION_SPRAY | CUTANEOUS | Status: DC | PRN
  Administered 2015-09-16: 1 via TOPICAL
  Filled 2015-09-16: qty 56

## 2015-09-16 MED ORDER — IBUPROFEN 600 MG PO TABS
600.0000 mg | ORAL_TABLET | Freq: Four times a day (QID) | ORAL | Status: DC
  Administered 2015-09-16 – 2015-09-17 (×8): 600 mg via ORAL
  Filled 2015-09-16 (×8): qty 1

## 2015-09-16 NOTE — Progress Notes (Signed)
This RN noted one elevated BP 30 minutes prior to delivery time and all other BP's have been WNL.  This RN will continue to monitor and assess patient per protocol.

## 2015-09-16 NOTE — Progress Notes (Signed)
Post Partum Day   Subjective:  Wendy Gay is a 25 y.o. G1P0101 [redacted]w[redacted]d .  No acute events overnight.  Pt denies problems with ambulating, voiding or po intake.  Sh edenies  nausea or vomiting.  Pain is well controlled.  She has had flatus. She has not had bowel movement.  Lochia small.  Plan for birth control is unknown at this time.  Method of Feeding:both breast and bottle feeding  Objective: BP 103/54 mmHg  Pulse 71  Temp(Src) 97.7 F (36.5 C) (Oral)  Resp 18  Ht  (1.549 m)  Wt 60.328 kg (133 lb)  BMI 25.14 kg/m2  LMP 12/18/2014 (Exact Date)  Breastfeeding? Unknown  Physical Exam:  General: alert, cooperative and no distress Lochia:normal flow Chest: CTAB Heart: RRR no m/r/g Abdomen: +BS, soft, nontender, fundus firm at/below umbilicus Uterine Fundus: firm, DVT Evaluation: No evidence of DVT seen on physical exam. Extremities: minimal edema   Recent Labs  09/15/15 2245  HGB 10.5*  HCT 32.7*    Assessment/Plan:  ASSESSMENT: Wendy Gay is a 25 y.o. G1P0101 [redacted]w[redacted]d ppd #1 s/p NSVD and is doing well.   Plan for discharge tomorrow   1.PPD #1: Continued ambulation encouraged. Pain is well controlled on scheduled ibuprofen.  2. Breast and Bottle Feeding 3. Method of Contraception unknown. Continue to educate patient of options and importance of birth control.   LOS: 1 day   Wendy Gay 09/16/2015, 7:50 AM   CNM attestation Post Partum Day #1  Wendy Gay is a 25 y.o. G1P0101 s/p SVD.  Pt denies problems with ambulating, voiding or po intake. Pain is well controlled.  Plan for birth control is undecided.  Method of Feeding: both  PE:  BP 123/76 mmHg  Pulse 87  Temp(Src) 98.9 F (37.2 C) (Oral)  Resp 18  Ht  (1.549 m)  Wt 60.328 kg (133 lb)  BMI 25.14 kg/m2  SpO2 100%  LMP 12/18/2014 (Exact Date)  Breastfeeding? Unknown Fundus firm  Plan for discharge: 09/17/15  Wendy Gay, CNM 11:09 PM

## 2015-09-17 MED ORDER — SENNOSIDES-DOCUSATE SODIUM 8.6-50 MG PO TABS: 1.0000 | ORAL_TABLET | Freq: Every day | ORAL | Status: DC

## 2015-09-17 MED ORDER — PRENATAL VITAMIN 27-0.8 MG PO TABS: 1.0000 | ORAL_TABLET | Freq: Every day | ORAL | Status: DC

## 2015-09-17 MED ORDER — IBUPROFEN 600 MG PO TABS: 600.0000 mg | ORAL_TABLET | Freq: Four times a day (QID) | ORAL | Status: DC | PRN

## 2015-09-17 NOTE — Progress Notes (Addendum)
Flu vacc recorded on prenatal record.  TDaP not recorded on prenatal record and patient denies receiving this vacc, so this RN gave patient information in [place] on TDaP vaccine prior to offering this to the patient.  Pt requested vaccine and vaccine given.

## 2015-09-17 NOTE — Discharge Instructions (Signed)
Sinh N? qua Âm ??o, Ch?m Sóc Sau khí Sinh °(Vaginal Delivery, Care After) °Tham kh?o t? thông tin này trong vài tu?n t?i. Nh?ng h??ng d?n xu?t vi?n này cung c?p cho b?n thông tin v? ch?m sóc b?n thân sau khi sinh. Chuyên gia ch?m sóc s?c kh?e c?ng có th? cung c?p cho b?n các h??ng d?n c? th?. ?i?u tr? c?a b?n ?ã ???c lên k? ho?ch theo th?c hành y khoa m?i nh?t s?n có, nh?ng v?n ?? ?ôi khi v?n x?y ra. Hãy g?i cho chuyên gia ch?m sóc s?c kh?e c?a mình n?u b?n có b?t k? v?n ?? ho?c câu h?i nào sau khi v? nhà. °H??NG D?N CH?M SÓC T?I NHÀ °· Ch? s? d?ng thu?c không c?n kê toa ho?c thu?c c?n kê toa theo ch? d?n c?a chuyên gia ch?m sóc s?c kh?e ho?c d??c s?. °· Không u?ng r??u ??c bi?t khi b?n ?ang nuôi con bú ho?c u?ng thu?c ?? gi?m ?au. °· Không nhai thu?c lá ho?c hút thu?c. °· Không s? d?ng ma túy b?t h?p pháp. °· Ti?p t?c ch?m sóc t?t vùng ?áy ch?u. Ch?m sóc t?t vùng ?áy ch?u bao g?m: °¨ Lau ?áy ch?u c?a b?n t? tr??c ra sau. °¨ Gi? ?áy ch?u c?a b?n s?ch s?. °· Không s? d?ng b?ng v? sinh ho?c th?t r?a âm ??o cho ??n khi chuyên gia ch?m sóc s?c kh?e cho phép. °· T?m, g?i ??u và t?m b?n theo ch? d?n c?a chuyên gia ch?m sóc s?c kh?e. °· M?c áo ng?c v?a v?n h? tr? ng?c. °· ?n th?c ph?m có l?i cho s?c kh?e. °· U?ng ?? n??c ?? gi? cho n??c ti?u trong ho?c vàng nh?t. °· ?n các lo?i th?c ph?m nhi?u ch?t x? nh? ng? c?c nguyên h?t, bánh mì, g?o l?c, các lo?i ??u, trái cây t??i và rau qu? m?i ngày. Nh?ng th?c ph?m này có th? giúp ng?n ng?a ho?c làm gi?m táo bón. °· Làm hi?n theo khuy?n ngh? c?a chuyên gia ch?m sóc s?c kh?e v? vi?c b?t ??u l?i các ho?t ??ng nh? leo c?u thang, lái xe, nâng, t?p th? d?c ho?c ?i l?i. °· Nói chuy?n v?i chuyên gia ch?m sóc s?c kh?e v? vi?c b?t ??u l?i ho?t ??ng tình d?c. Vi?c b?t ??u l?i ho?t ??ng tình d?c tùy thu?c vào nguy c? nhi?m trùng, m?c ?? lành b?nh, c?m giác tho?i mái và mong mu?n ti?p t?c ho?t ??ng tình d?c c?a b?n. °· C? g?ng ?? có ai ?ó giúp b?n các công vi?c n?i tr? và tr? m?i sinh cho ít  nh?t m?t vài ngày sau khi xu?t vi?n. °· Ngh? ng?i càng nhi?u càng t?t. C? g?ng ngh? ng?i ho?c có gi?c ng? ng?n khi tr? m?i sinh ?ang ng?. °· T?ng ho?t ??ng d?n d?n. °· Tuân th? m?i cu?c h?n liên quan ngay sau khi sinh ?ã ???c s?p x?p l?ch. ?i?u r?t quan tr?ng là ph?i gi? m?i cu?c h?n khám l?i ?ã ???c s?p x?p l?ch. T?i các cu?c h?n này, chuyên gia ch?m sóc s?c kh?e s? ki?m tra ?? ??m b?o r?ng b?n ?ang h?i ph?c v? m?t th? ch?t và tình c?m. °HÃY ?I KHÁM N?U: °· Âm ??o cho ra các c?c máu ?ông l?n. Gi? l?i m?i c?c máu ?ông ?? chuyên gia ch?m sóc s?c kh?e xem. °· D?ch âm ??o có mùi hôi. °· B?n g?p v?n ?? khi ti?u ti?n. °· B?n ?i ti?u th??ng xuyên. °· B?n b? ?au khi ?i ti?u. °· Có s? thay ??i trong ??  i ti?n.  B? t?y ??, ?au ho?c s?ng g?n v?t r?ch m ??o (c?t t?ng sinh mn) hay v?t rch m ??o.  C m? ch?y ra t? v?t c?t t?ng sinh mn ho?c v?t rch m ??o.  V?t c?t t?ng sinh mn ho?c v?t rch m ??o h mi?ng.  Ng?c b? ?au, c?ng ho?c ??.  B?n b? ?au ??u r?t nhi?u.  M?t b?n b? m? ho?c nhn th?y cc v?t l?m ??m.  B?n c?m th?y bu?n ho?c chn n?n.  B?n c suy ngh? v? vi?c t? lm t?n th??ng mnh ho?c tr? m?i sinh.  B?n c cu h?i v? vi?c ch?m [place] b?n thn, ch?m [place] tr? m?i sinh ho?c v? thu?c.  B?n b? chng m?t ho?c ?au ??u.  B?n b? pht ban.  B?n b? bu?n nn ho?c nn m?a.  B?n ? cho con b v ch?a c kinh nguy?t trong vng 12 tu?n sau khi ng?ng cho con b.  B?n khng cho con b v ch?a c kinh nguy?t trong kho?ng 12 tu?n sau khi sinh.  B?n b? s?t. HY NGAY L?P T?C ?I KHM N?U:  B?n b? ?au dai d?ng.  B?n b? ?au ng?c.  B?n b? kh th?.  B?n b? ng?t.  B?n b? ?au chn.  B?n b? ?au d? dy.  Ch?y mu m ??o th?m ??m hai mi?ng dn v? sinh tr? ln trong 1 ti?ng.   Thng tin ny khng nh?m m?c ?ch thay th? cho l?i khuyn m chuyn gia ch?m [place] s?c kh?e ni v?i qu v?. Hy b?o ??m qu v? ph?i th?o lu?n b?t k? v?n ?? g m qu v? c v?i chuyn gia ch?m [place] s?c kh?e c?a qu v?.   Document  Released: 08/05/2005 Document Revised: 04/26/2015 Elsevier Interactive Patient Education 2016 Elsevier Inc.  Vaginal Delivery, Care After Refer to this sheet in the next few weeks. These discharge instructions provide you with information on caring for yourself after delivery. Your health care provider may also give you specific instructions. Your treatment has been planned according to the most current medical practices available, but problems sometimes occur. Call your health care provider if you have any problems or questions after you go home. HOME CARE INSTRUCTIONS  Take over-the-counter or prescription medicines only as directed by your health care provider or pharmacist.  Do not drink alcohol, especially if you are breastfeeding or taking medicine to relieve pain.  Do not chew or smoke tobacco.  Do not use illegal drugs.  Continue to use good perineal care. Good perineal care includes:  Wiping your perineum from front to back.  Keeping your perineum clean.  Do not use tampons or douche until your health care provider says it is okay.  Shower, wash your hair, and take tub baths as directed by your health care provider.  Wear a well-fitting bra that provides breast support.  Eat healthy foods.  Drink enough fluids to keep your urine clear or pale yellow.  Eat high-fiber foods such as whole grain cereals and breads, brown rice, beans, and fresh fruits and vegetables every day. These foods may help prevent or relieve constipation.  Follow your health care provider's recommendations regarding resumption of activities such as climbing stairs, driving, lifting, exercising, or traveling.  Talk to your health care provider about resuming sexual activities. Resumption of sexual activities is dependent upon your risk of infection, your rate of healing, and your comfort and desire to resume sexual activity.  Try to have someone help you with your  household activities and your newborn  for at least a few days after you leave the hospital.  Rest as much as possible. Try to rest or take a nap when your newborn is sleeping.  Increase your activities gradually.  Keep all of your scheduled postpartum appointments. It is very important to keep your scheduled follow-up appointments. At these appointments, your health care provider will be checking to make sure that you are healing physically and emotionally. SEEK MEDICAL CARE IF:   You are passing large clots from your vagina. Save any clots to show your health care provider.  You have a foul smelling discharge from your vagina.  You have trouble urinating.  You are urinating frequently.  You have pain when you urinate.  You have a change in your bowel movements.  You have increasing redness, pain, or swelling near your vaginal incision (episiotomy) or vaginal tear.  You have pus draining from your episiotomy or vaginal tear.  Your episiotomy or vaginal tear is separating.  You have painful, hard, or reddened breasts.  You have a severe headache.  You have blurred vision or see spots.  You feel sad or depressed.  You have thoughts of hurting yourself or your newborn.  You have questions about your care, the care of your newborn, or medicines.  You are dizzy or light-headed.  You have a rash.  You have nausea or vomiting.  You were breastfeeding and have not had a menstrual period within 12 weeks after you stopped breastfeeding.  You are not breastfeeding and have not had a menstrual period by the 12th week after delivery.  You have a fever. SEEK IMMEDIATE MEDICAL CARE IF:   You have persistent pain.  You have chest pain.  You have shortness of breath.  You faint.  You have leg pain.  You have stomach pain.  Your vaginal bleeding saturates two or more sanitary pads in 1 hour.   This information is not intended to replace advice given to you by your health care provider. Make sure you  discuss any questions you have with your health care provider.   Document Released: 08/02/2000 Document Revised: 04/26/2015 Document Reviewed: 04/01/2012 Elsevier Interactive Patient Education [employer]

## 2015-09-17 NOTE — Lactation Note (Signed)
This note was copied from the chart of Wendy Syann K. Lactation Consultation Note  Mom attempting to latch baby when I came for visit.  Baby fussy and having difficulty latching.  Calmed baby on a gloved finger and baby then latched.  Baby fell asleep after 5 minutes on breast.  Instructed mom to continue pumping every 3 hours and supplement with expressed milk/formula  Patient Name: Wendy Gay [id number] Date: 09/17/2015 Reason for consult: Initial assessment;Infant < 6lbs;Late preterm infant;Hyperbilirubinemia   Maternal Data    Feeding Feeding Type: Breast Fed Length of feed: 5 min  LATCH Score/Interventions Latch: Repeated attempts needed to sustain latch, nipple held in mouth throughout feeding, stimulation needed to elicit sucking reflex. Intervention(s): Adjust position;Assist with latch;Breast massage;Breast compression  Audible Swallowing: A few with stimulation  Type of Nipple: Everted at rest and after stimulation  Comfort (Breast/Nipple): Soft / non-tender     Hold (Positioning): Assistance needed to correctly position infant at breast and maintain latch.  LATCH Score: 7  Lactation Tools Discussed/Used Pump Review: Setup, frequency, and cleaning;Milk Storage Initiated by:: RN Date initiated:: 09/15/15   Consult Status Consult Status: Follow-up Date: 09/18/15 Follow-up type: In-patient    Huston Foley 09/17/2015, 11:18 AM

## 2015-09-17 NOTE — Discharge Summary (Signed)
OB Discharge Summary     Patient Name: Wendy Gay DOB: 16-Oct-1990 MRN: [id number]  Date of admission: 09/15/2015 Delivering MD: Cam Hai D   Date of discharge: 09/17/2015  Admitting diagnosis: 36w ctx, water broke Intrauterine pregnancy: [redacted]w[redacted]d     Secondary diagnosis:  Active Problems:   Active labor preterm and SROM   Additional problems:  Positive Chlamydia in pregnancy with negative TOC x 2 Unknown GBS-inadequate tx      Discharge diagnosis: Preterm Pregnancy Delivered                                                                                                Post partum procedures:none  Augmentation: none  Complications: None  Hospital course:  Onset of Labor With Vaginal Delivery     25 y.o. yo G1P0101 at [redacted]w[redacted]d was admitted in Active Labor on 09/15/2015. Patient had an uncomplicated labor course as follows:  Membrane Rupture Time/Date: 10:15 PM ,09/15/2015   Intrapartum Procedures: Episiotomy: None [1]                                         Lacerations:  1st degree [2];Perineal [11]  Patient had a delivery of a Viable infant. 09/15/2015  Information for the patient's newborn:  Kirtland Bouchard Girl Rozell [[id number]]  Delivery Method: Vaginal, Spontaneous Delivery (Filed from Delivery Summary)    Pateint had an uncomplicated postpartum course.  She is ambulating, tolerating a regular diet, passing flatus, and urinating well. Patient is discharged home in stable condition on 09/17/2015.    Physical exam  Filed Vitals:   09/16/15 1250 09/16/15 1808 09/17/15 0300 09/17/15 0600  BP: 104/59 113/65 116/72 120/75  Pulse: 61 75 78 81  Temp: 98.1 F (36.7 C) 98.4 F (36.9 C) 98.5 F (36.9 C) 97.6 F (36.4 C)  TempSrc: Oral Oral Oral Oral  Resp: Height:      Weight:      SpO2: 98% 96% 100% 100%   General: alert, cooperative and no distress Lochia: appropriate Uterine Fundus: firm Incision: N/A DVT Evaluation: No evidence of DVT seen on physical exam. No  cords or calf tenderness. No significant calf/ankle edema. Labs: Lab Results  Component Value Date   WBC 15.4* 09/15/2015   HGB 10.5* 09/15/2015   HCT 32.7* 09/15/2015   MCV 70.0* 09/15/2015   PLT 278 09/15/2015   CMP Latest Ref Rng 03/16/2015  Glucose 65 - 99 mg/dL 84  BUN 6 - 20 mg/dL 9  Creatinine 7.82 - 9.56 mg/dL 2.13  Sodium 086 - 578 mmol/L 134(L)  Potassium 3.5 - 5.1 mmol/L 3.6  Chloride 101 - 111 mmol/L 107  CO2 22 - 32 mmol/L 23  Calcium 8.9 - 10.3 mg/dL 4.6(N)  Total Protein 6.5 - 8.1 g/dL 7.0  Total Bilirubin 0.3 - 1.2 mg/dL 0.5  Alkaline Phos 38 - 126 U/L 49  AST 15 - 41 U/L 17  ALT 14 - 54 U/L 10(L)    Discharge instruction: per After  Visit Summary and "Baby and Me Booklet".  After visit meds:    Medication List    Notice    You have not been prescribed any medications.      Diet: routine diet  Activity: Advance as tolerated. Pelvic rest for 6 weeks.   Outpatient follow up:6 weeks Follow up Appt:No future appointments. Follow up Visit:No Follow-up on file.  Postpartum contraception: None  Newborn Data: Live born female  Birth Weight: 5 lb 2.2 oz (2330 g) APGAR: 9, 9  Baby Feeding: Bottle and Breast Disposition:rooming in   09/17/2015 Palma Holter, MD   OB FELLOW DISCHARGE ATTESTATION  I have seen and examined this patient and agree with above documentation in the resident's note.   Silvano Bilis, MD 9:14 AM

## 2015-09-18 ENCOUNTER — Ambulatory Visit: Payer: Self-pay

## 2015-09-18 NOTE — Lactation Note (Signed)
This note was copied from the chart of Wendy Gay. Lactation Consultation Note  Patient Name: Wendy Gay [id number] Date: 09/18/2015   Visited with Mom, baby 4 hrs old.  Baby sleeping wrapped up.  Mom declined any assistance with breast feeding, just wants to give formula.  Discussed pumping, but Mom will be returning to work fairly quickly, so does not want to pump. Offered her to call for help if she changes her mind.  Engorgement treatment discussed- using ice packs.  Encouraged to feed baby increasing amounts as tolerated every 3 hrs.  She states she is aware.      Judee Clara 09/18/2015, 2:29 PM

## 2018-10-12 ENCOUNTER — Other Ambulatory Visit: Payer: Self-pay

## 2018-10-12 ENCOUNTER — Ambulatory Visit (HOSPITAL_COMMUNITY)
Admission: EM | Admit: 2018-10-12 | Discharge: 2018-10-12 | Disposition: A | Discharge status: Home / Self Care | Payer: Self-pay | Attending: Family Medicine | Admitting: Family Medicine

## 2018-10-12 ENCOUNTER — Encounter (HOSPITAL_COMMUNITY): Payer: Self-pay

## 2018-10-12 DIAGNOSIS — B001 Herpesviral vesicular dermatitis: Secondary | ICD-10-CM

## 2018-10-12 MED ORDER — VALACYCLOVIR HCL 1 G PO TABS: ORAL_TABLET | ORAL | 1 refills | Status: DC

## 2018-10-12 NOTE — ED Triage Notes (Signed)
Pt cc she has fever blister on her lip and some neck pain. X 2 days

## 2018-10-12 NOTE — Discharge Instructions (Addendum)
Use this medicine at the first sign of a cold sore

## 2018-10-12 NOTE — ED Provider Notes (Signed)
[facility]    CSN: [id number] Arrival date & time: 10/12/18  4492     History   Chief Complaint No chief complaint on file.   HPI Wendy Gay is a 28 y.o. female.   HPI  Patient gets recurrent cold sores.  This time she has a swelling underneath her chin that worries her.  She is otherwise well.  No fever chills.  No respiratory complaints.  Past Medical History:  Diagnosis Date  . Medical history non-contributory     Patient Active Problem List   Diagnosis Date Noted  . Active labor at term 09/15/2015    Past Surgical History:  Procedure Laterality Date  . NO PAST SURGERIES      OB History    Gravida  1   Para  1   Term      Preterm  1   AB      Living  1     SAB      TAB      Ectopic      Multiple  0   Live Births  1            Home Medications    Prior to Admission medications   Medication Sig Start Date End Date Taking? Authorizing Provider  ibuprofen (ADVIL,MOTRIN) 600 MG tablet Take 1 tablet (600 mg total) by mouth every 6 (six) hours as needed for mild pain, moderate pain or cramping. 09/17/15   Palma Holter, MD  Prenatal Vit-Fe Fumarate-FA (PRENATAL VITAMIN) 27-0.8 MG TABS Take 1 tablet by mouth daily. 09/17/15   Palma Holter, MD  senna-docusate (SENOKOT-S) 8.6-50 MG tablet Take 1 tablet by mouth at bedtime. 09/17/15   Palma Holter, MD  valACYclovir (VALTREX) 1000 MG tablet Take 2 in tabs at first sign of cold sore, repeat dose in 12 hours 10/12/18   Eustace Moore, MD    Family History History reviewed. No pertinent family history.  Social History Social History   Tobacco Use  . Smoking status: Never Smoker  . Smokeless tobacco: Never Used  Substance Use Topics  . Alcohol use: No  . Drug use: No     Allergies   Patient has no known allergies.   Review of Systems Review of Systems  Constitutional: Negative for chills and fever.  HENT: Positive for mouth sores. Negative for ear pain  and sore throat.   Eyes: Negative for pain and visual disturbance.  Respiratory: Negative for cough and shortness of breath.   Cardiovascular: Negative for chest pain and palpitations.  Gastrointestinal: Negative for abdominal pain and vomiting.  Genitourinary: Negative for dysuria and hematuria.  Musculoskeletal: Negative for arthralgias and back pain.  Skin: Negative for color change and rash.  Neurological: Negative for seizures and syncope.  All other systems reviewed and are negative.    Physical Exam Triage Vital Signs ED Triage Vitals  Enc Vitals Group     BP 10/12/18 0953 124/62     Pulse Rate 10/12/18 0953 94     Resp 10/12/18 0953 16     Temp 10/12/18 0953 99.5 F (37.5 C)     Temp Source 10/12/18 0953 Oral     SpO2 10/12/18 0953 100 %     Weight 10/12/18 0956 98 lb (44.5 kg)     Height --      Head Circumference --      Peak Flow --      Pain Score 10/12/18 0955 6  Pain Loc --      Pain Edu? --      Excl. in GC? --    No data found.  Updated Vital Signs BP 124/62 (BP Location: Right Arm)   Pulse 94   Temp 99.5 F (37.5 C) (Oral)   Resp 16   Wt 44.5 kg   LMP 10/06/2018   SpO2 100%   BMI 18.52 kg/m    Physical Exam Constitutional:      General: She is not in acute distress.    Appearance: She is well-developed.  HENT:     Head: Normocephalic and atraumatic.     Mouth/Throat:     Mouth: Oral lesions present.   Eyes:     Conjunctiva/sclera: Conjunctivae normal.     Pupils: Pupils are equal, round, and reactive to light.  Neck:     Musculoskeletal: Normal range of motion.  Cardiovascular:     Rate and Rhythm: Normal rate.  Pulmonary:     Effort: Pulmonary effort is normal. No respiratory distress.  Abdominal:     General: There is no distension.     Palpations: Abdomen is soft.  Musculoskeletal: Normal range of motion.  Skin:    General: Skin is warm and dry.  Neurological:     Mental Status: She is alert.      UC Treatments /  Results  Labs (all labs ordered are listed, but only abnormal results are displayed) Labs Reviewed - No data to display  EKG None  Radiology No results found.  Procedures Procedures (including critical care time)  Medications Ordered in UC Medications - No data to display  Initial Impression / Assessment and Plan / UC Course  I have reviewed the triage vital signs and the nursing notes.  Pertinent labs & imaging results that were available during my care of the patient were reviewed by me and considered in my medical decision making (see chart for details).     I explained that the swollen area under her jawline was a lymph node.  It swells in response to infection.  The infection on her lip is causing the node to swell and is a normal reaction.  Discussed recurring cold sore treatment Final Clinical Impressions(s) / UC Diagnoses   Final diagnoses:  Recurrent cold sores     Discharge Instructions     Use this medicine at the first sign of a cold sore   ED Prescriptions    Medication Sig Dispense Auth. Provider   valACYclovir (VALTREX) 1000 MG tablet Take 2 in tabs at first sign of cold sore, repeat dose in 12 hours 4 tablet Eustace Moore, MD     Controlled Substance Prescriptions [place] Controlled Substance Registry consulted? Not Applicable   Eustace Moore, MD 10/12/18 1021

## 2018-12-16 ENCOUNTER — Ambulatory Visit (HOSPITAL_COMMUNITY)
Admission: EM | Admit: 2018-12-16 | Discharge: 2018-12-16 | Disposition: A | Discharge status: Home / Self Care | Payer: Self-pay | Attending: Family Medicine | Admitting: Family Medicine

## 2018-12-16 ENCOUNTER — Other Ambulatory Visit: Payer: Self-pay

## 2018-12-16 ENCOUNTER — Encounter (HOSPITAL_COMMUNITY): Payer: Self-pay

## 2018-12-16 DIAGNOSIS — W57XXXA Bitten or stung by nonvenomous insect and other nonvenomous arthropods, initial encounter: Secondary | ICD-10-CM

## 2018-12-16 DIAGNOSIS — S90562A Insect bite (nonvenomous), left ankle, initial encounter: Secondary | ICD-10-CM

## 2018-12-16 DIAGNOSIS — S90561A Insect bite (nonvenomous), right ankle, initial encounter: Secondary | ICD-10-CM

## 2018-12-16 MED ORDER — TRIAMCINOLONE ACETONIDE 0.1 % EX CREA: 1.0000 "application " | TOPICAL_CREAM | Freq: Two times a day (BID) | CUTANEOUS | 0 refills | Status: DC

## 2018-12-16 MED ORDER — CETIRIZINE HCL 10 MG PO TABS: 10.0000 mg | ORAL_TABLET | Freq: Every day | ORAL | 0 refills | Status: DC

## 2018-12-16 NOTE — ED Triage Notes (Signed)
Pt cc she has a rash on her feet. Pt states this started last Thursday. Pt says it does itch.

## 2018-12-16 NOTE — ED Provider Notes (Signed)
[facility]    CSN: [id number] Arrival date & time: 12/16/18  3235     History   Chief Complaint Chief Complaint  Patient presents with  . Rash    HPI Wendy Gay is a 28 y.o. female.   28 year old female comes in for rash to bilateral foot/ankle x 6 days. Rash occurred after walking at a park. Rash is itching in nature and worse at night. States now can be painful when scratching. Denies spreading rash, spreading erythema, warmth, fever. Has been applying ointment over the area without relief.      Past Medical History:  Diagnosis Date  . Medical history non-contributory     Patient Active Problem List   Diagnosis Date Noted  . Active labor at term 09/15/2015    Past Surgical History:  Procedure Laterality Date  . NO PAST SURGERIES      OB History    Gravida  1   Para  1   Term      Preterm  1   AB      Living  1     SAB      TAB      Ectopic      Multiple  0   Live Births  1            Home Medications    Prior to Admission medications   Medication Sig Start Date End Date Taking? Authorizing Provider  cetirizine (ZYRTEC) 10 MG tablet Take 1 tablet (10 mg total) by mouth daily. 12/16/18   Cathie Hoops, Vernecia Umble V, PA-C  ibuprofen (ADVIL,MOTRIN) 600 MG tablet Take 1 tablet (600 mg total) by mouth every 6 (six) hours as needed for mild pain, moderate pain or cramping. 09/17/15   Palma Holter, MD  Prenatal Vit-Fe Fumarate-FA (PRENATAL VITAMIN) 27-0.8 MG TABS Take 1 tablet by mouth daily. 09/17/15   Palma Holter, MD  senna-docusate (SENOKOT-S) 8.6-50 MG tablet Take 1 tablet by mouth at bedtime. 09/17/15   Palma Holter, MD  triamcinolone cream (KENALOG) 0.1 % Apply 1 application topically 2 (two) times daily. 12/16/18   Belinda Fisher, PA-C  valACYclovir (VALTREX) 1000 MG tablet Take 2 in tabs at first sign of cold sore, repeat dose in 12 hours 10/12/18   Eustace Moore, MD    Family History History reviewed. No pertinent family  history.  Social History Social History   Tobacco Use  . Smoking status: Never Smoker  . Smokeless tobacco: Never Used  Substance Use Topics  . Alcohol use: No  . Drug use: No     Allergies   Patient has no known allergies.   Review of Systems Review of Systems  Reason unable to perform ROS: See HPI as above.     Physical Exam Triage Vital Signs ED Triage Vitals  Enc Vitals Group     BP 12/16/18 1001 126/80     Pulse Rate 12/16/18 1001 80     Resp 12/16/18 1001 16     Temp 12/16/18 1001 98.7 F (37.1 C)     Temp Source 12/16/18 1001 Oral     SpO2 12/16/18 1001 100 %     Weight 12/16/18 1003 100 lb (45.4 kg)     Height --      Head Circumference --      Peak Flow --      Pain Score 12/16/18 1003 6     Pain Loc --  Pain Edu? --      Excl. in GC? --    No data found.  Updated Vital Signs BP 126/80 (BP Location: Right Arm)   Pulse 80   Temp 98.7 F (37.1 C) (Oral)   Resp 16   Wt 100 lb (45.4 kg)   LMP 12/16/2018   SpO2 100%   BMI 18.89 kg/m   Physical Exam Constitutional:      General: She is not in acute distress.    Appearance: She is well-developed. She is not diaphoretic.  HENT:     Head: Normocephalic and atraumatic.  Eyes:     Conjunctiva/sclera: Conjunctivae normal.     Pupils: Pupils are equal, round, and reactive to light.  Skin:    Comments: Multiple maculopapular rash with central scabbing consistent with insect bites to the right lateral ankle, left lateral ankle. Mild surrounding erythema. No warmth. No tenderness to palpation. No fluctuance felt.   Neurological:     Mental Status: She is alert and oriented to person, place, and time.      UC Treatments / Results  Labs (all labs ordered are listed, but only abnormal results are displayed) Labs Reviewed - No data to display  EKG None  Radiology No results found.  Procedures Procedures (including critical care time)  Medications Ordered in UC Medications - No data to  display  Initial Impression / Assessment and Plan / UC Course  I have reviewed the triage vital signs and the nursing notes.  Pertinent labs & imaging results that were available during my care of the patient were reviewed by me and considered in my medical decision making (see chart for details).    Triamcinolone cream as directed. Antihistamine for itching. Return precautions given. Patient expresses understanding and agrees to plan.  Final Clinical Impressions(s) / UC Diagnoses   Final diagnoses:  Multiple insect bites    ED Prescriptions    Medication Sig Dispense Auth. Provider   triamcinolone cream (KENALOG) 0.1 % Apply 1 application topically 2 (two) times daily. 30 g Bryer Gottsch V, PA-C   cetirizine (ZYRTEC) 10 MG tablet Take 1 tablet (10 mg total) by mouth daily. 15 tablet Threasa AlphaYu, Piera Downs V, PA-C        Tamitha Norell V, [place]PA-C 12/16/18 1025

## 2018-12-16 NOTE — Discharge Instructions (Addendum)
Start triamcinolone cream as directed. Zyrtec for itching. Ice compress as needed for itching. Refrain from scratching. Monitor for spreading redness, increased warmth, fever, follow up for reevaluation needed.

## 2019-02-02 ENCOUNTER — Telehealth: Payer: Self-pay | Admitting: *Deleted

## 2019-02-02 ENCOUNTER — Ambulatory Visit (HOSPITAL_COMMUNITY)
Admission: EM | Admit: 2019-02-02 | Discharge: 2019-02-02 | Disposition: A | Discharge status: Home / Self Care | Payer: Self-pay | Attending: Internal Medicine | Admitting: Internal Medicine

## 2019-02-02 ENCOUNTER — Other Ambulatory Visit: Payer: Self-pay

## 2019-02-02 ENCOUNTER — Encounter (HOSPITAL_COMMUNITY): Payer: Self-pay

## 2019-02-02 DIAGNOSIS — Z20828 Contact with and (suspected) exposure to other viral communicable diseases: Secondary | ICD-10-CM

## 2019-02-02 DIAGNOSIS — R05 Cough: Secondary | ICD-10-CM

## 2019-02-02 DIAGNOSIS — Z20822 Contact with and (suspected) exposure to covid-19: Secondary | ICD-10-CM

## 2019-02-02 DIAGNOSIS — R6889 Other general symptoms and signs: Secondary | ICD-10-CM | POA: Insufficient documentation

## 2019-02-02 DIAGNOSIS — R509 Fever, unspecified: Secondary | ICD-10-CM

## 2019-02-02 DIAGNOSIS — J029 Acute pharyngitis, unspecified: Secondary | ICD-10-CM

## 2019-02-02 LAB — POCT RAPID STREP A: Streptococcus, Group A Screen (Direct): NEGATIVE

## 2019-02-02 MED ORDER — BENZONATATE 200 MG PO CAPS: 200.0000 mg | ORAL_CAPSULE | Freq: Three times a day (TID) | ORAL | 0 refills | Status: AC | PRN

## 2019-02-02 NOTE — ED Provider Notes (Signed)
[facility]    CSN: [id number] Arrival date & time: 02/02/19  [id number]      History   Chief Complaint Chief Complaint  Patient presents with  . Sore Throat    HPI Wendy Gay is a 28 y.o. female no significant past medical history presenting today for evaluation of sore throat, cough and fever.  Patient states that her symptoms began Sunday and have persisted for the past 3 days.  Her main complaint is her sore throat, notes that the cough is been mild.  She notes that her dad has been sick as well and her brother has tested positive for COVID.  She denies any chest pain or shortness of breath.  Denies history of asthma or COPD.  Denies tobacco use.  She has taken some Tylenol but has not had full relief of fever and headache.  Denies nausea or vomiting.  Tolerating oral intake.  HPI  Past Medical History:  Diagnosis Date  . Medical history non-contributory     Patient Active Problem List   Diagnosis Date Noted  . Active labor at term 09/15/2015    Past Surgical History:  Procedure Laterality Date  . NO PAST SURGERIES      OB History    Gravida  1   Para  1   Term      Preterm  1   AB      Living  1     SAB      TAB      Ectopic      Multiple  0   Live Births  1            Home Medications    Prior to Admission medications   Medication Sig Start Date End Date Taking? Authorizing Provider  benzonatate (TESSALON) 200 MG capsule Take 1 capsule (200 mg total) by mouth 3 (three) times daily as needed for up to 7 days for cough. 02/02/19 02/09/19  Markelle Najarian C, PA-C  cetirizine (ZYRTEC) 10 MG tablet Take 1 tablet (10 mg total) by mouth daily. 12/16/18   Cathie HoopsYu, Amy V, PA-C  ibuprofen (ADVIL,MOTRIN) 600 MG tablet Take 1 tablet (600 mg total) by mouth every 6 (six) hours as needed for mild pain, moderate pain or cramping. 09/17/15   Palma HolterGunadasa, Kanishka G, MD  Prenatal Vit-Fe Fumarate-FA (PRENATAL VITAMIN) 27-0.8 MG TABS Take 1 tablet by mouth daily.  09/17/15   Palma HolterGunadasa, Kanishka G, MD  senna-docusate (SENOKOT-S) 8.6-50 MG tablet Take 1 tablet by mouth at bedtime. 09/17/15   Palma HolterGunadasa, Kanishka G, MD  triamcinolone cream (KENALOG) 0.1 % Apply 1 application topically 2 (two) times daily. 12/16/18   Belinda FisherYu, Amy V, PA-C  valACYclovir (VALTREX) 1000 MG tablet Take 2 in tabs at first sign of cold sore, repeat dose in 12 hours 10/12/18   Eustace MooreNelson, Yvonne Sue, MD    Family History History reviewed. No pertinent family history.  Social History Social History   Tobacco Use  . Smoking status: Never Smoker  . Smokeless tobacco: Never Used  Substance Use Topics  . Alcohol use: No  . Drug use: No     Allergies   Patient has no known allergies.   Review of Systems Review of Systems  Constitutional: Positive for fatigue and fever. Negative for activity change, appetite change and chills.  HENT: Positive for sore throat. Negative for congestion, ear pain, rhinorrhea, sinus pressure and trouble swallowing.   Eyes: Negative for discharge and redness.  Respiratory: Positive for  cough. Negative for chest tightness and shortness of breath.   Cardiovascular: Negative for chest pain.  Gastrointestinal: Negative for abdominal pain, diarrhea, nausea and vomiting.  Musculoskeletal: Negative for myalgias.  Skin: Negative for rash.  Neurological: Positive for headaches. Negative for dizziness and light-headedness.     Physical Exam Triage Vital Signs ED Triage Vitals  Enc Vitals Group     BP 02/02/19 1014 122/75     Pulse Rate 02/02/19 1014 (!) 104     Resp 02/02/19 1014 16     Temp 02/02/19 1014 (!) 100.9 F (38.3 C)     Temp Source 02/02/19 1014 Oral     SpO2 02/02/19 1014 100 %     Weight 02/02/19 1015 100 lb (45.4 kg)     Height --      Head Circumference --      Peak Flow --      Pain Score 02/02/19 1048 6     Pain Loc --      Pain Edu? --      Excl. in GC? --    No data found.  Updated Vital Signs BP 122/75 (BP Location: Left Arm)    Pulse (!) 104   Temp (!) 100.9 F (38.3 C) (Oral)   Resp 16   Wt 100 lb (45.4 kg)   LMP 01/26/2019   SpO2 100%   BMI 18.89 kg/m   Visual Acuity Right Eye Distance:   Left Eye Distance:   Bilateral Distance:    Right Eye Near:   Left Eye Near:    Bilateral Near:     Physical Exam Vitals signs and nursing note reviewed.  Constitutional:      General: She is not in acute distress.    Appearance: She is well-developed.  HENT:     Head: Normocephalic and atraumatic.     Ears:     Comments: Bilateral ears without tenderness to palpation of external auricle, tragus and mastoid, EAC's without erythema or swelling, TM's with good bony landmarks and cone of light. Non erythematous.     Mouth/Throat:     Comments: Oral mucosa pink and moist, no tonsillar enlargement or exudate. Posterior pharynx patent and nonerythematous, no uvula deviation or swelling. Normal phonation.  Eyes:     Conjunctiva/sclera: Conjunctivae normal.  Neck:     Musculoskeletal: Neck supple.     Comments: Anterior neck with 6 erythematous, non-blanchable lesions, not notable elsewhere on body Cardiovascular:     Rate and Rhythm: Normal rate and regular rhythm.     Heart sounds: No murmur.  Pulmonary:     Effort: Pulmonary effort is normal. No respiratory distress.     Breath sounds: Normal breath sounds.     Comments: Breathing comfortably at rest, CTABL, no wheezing, rales or other adventitious sounds auscultated  Abdominal:     Palpations: Abdomen is soft.     Tenderness: There is no abdominal tenderness.  Skin:    General: Skin is warm and dry.  Neurological:     Mental Status: She is alert.      UC Treatments / Results  Labs (all labs ordered are listed, but only abnormal results are displayed) Labs Reviewed  CULTURE, GROUP A STREP [facility]  POCT RAPID STREP A    EKG None  Radiology No results found.  Procedures Procedures (including critical care time)  Medications Ordered in UC  Medications - No data to display  Initial Impression / Assessment and Plan / UC Course  I  have reviewed the triage vital signs and the nursing notes.  Pertinent labs & imaging results that were available during my care of the patient were reviewed by me and considered in my medical decision making (see chart for details).     Strep test negative, sore throat cough and fever with known exposure to COVID.  Most likely COVID.  Will set up for COVID testing.  Will recommend symptomatic and supportive care, lungs clear at this time, rest, fluids, anti-inflammatories for fever/headache. Discussed strict return precautions. Patient verbalized understanding and is agreeable with plan.  Final Clinical Impressions(s) / UC Diagnoses   Final diagnoses:  Suspected Covid-19 Virus Infection     Discharge Instructions     Someone will be in contact with you to set up appointment for [place] testing [place]. is the address  Your symptoms are most likely COVID which is a virus and runs its course over a couple of weeks Please rest, drink plenty fluids  Isolate until symptoms resolved Tessalon as needed for cough Tylenol for fever, headache, body aches, may supplement with ibuprofen if needed  Please follow-up if developing worsening symptoms, persistent fever, worsening cough, shortness of breath or chest pain, fatigue     ED Prescriptions    Medication Sig Dispense Auth. Provider   benzonatate (TESSALON) 200 MG capsule Take 1 capsule (200 mg total) by mouth 3 (three) times daily as needed for up to 7 days for cough. 28 capsule Zeus Marquis C, PA-C     Controlled Substance Prescriptions [place] Controlled Substance Registry consulted? Not Applicable   Janith Lima, [place] 02/02/19 1055

## 2019-02-02 NOTE — ED Triage Notes (Signed)
Pt states she has a sore throat and cough x 3 days.

## 2019-02-02 NOTE — Telephone Encounter (Signed)
Using [employer] for [place].  # [id number].  I scheduled pt for COVID-19 test for today at 2:45 at the [facility] location.  I made her aware to stay in car and wear a mask.    Order entered  No insurance

## 2019-02-02 NOTE — Discharge Instructions (Signed)
Someone will be in contact with you to set up appointment for [place] testing [place]. is the address  Your symptoms are most likely COVID which is a virus and runs its course over a couple of weeks Please rest, drink plenty fluids  Isolate until symptoms resolved Tessalon as needed for cough Tylenol for fever, headache, body aches, may supplement with ibuprofen if needed  Please follow-up if developing worsening symptoms, persistent fever, worsening cough, shortness of breath or chest pain, fatigue

## 2019-02-02 NOTE — Telephone Encounter (Signed)
I used [employer] [phone] Altha Harm for [place].  Interpreter left a voicemail for pt to call us back at [phone] to schedule COVID-19 test.   Any of the nurses can help her with scheduling the test.

## 2019-02-04 ENCOUNTER — Telehealth (HOSPITAL_COMMUNITY): Payer: Self-pay | Admitting: Emergency Medicine

## 2019-02-04 LAB — CULTURE, GROUP A STREP (THRC)

## 2019-02-04 NOTE — Telephone Encounter (Signed)
Your test for COVID-19 was positive, meaning that you were infected with the novel coronavirus and could give the germ to others.  Please continue isolation at home, for at least 10 days since the start of your fever/cough/breathlessness and until you have had 3 consecutive days without fever (without taking a fever reducer) and with cough/breathlessness improving. Please continue good preventive care measures, including:  frequent hand-washing, avoid touching your face, cover coughs/sneezes, stay out of crowds and keep a 6 foot distance from others.  Recheck or go to the nearest hospital ED tent for re-assessment if fever/cough/breathlessness return.  Attempted to reach patient. No answer at this time. Voicemail left.  Used language line to leave voicemail.

## 2019-02-05 ENCOUNTER — Telehealth (HOSPITAL_COMMUNITY): Payer: Self-pay | Admitting: Emergency Medicine

## 2019-02-05 LAB — NOVEL CORONAVIRUS, NAA: SARS-CoV-2, NAA: DETECTED — AB

## 2019-02-05 NOTE — Telephone Encounter (Signed)
Patient contacted and made aware of all results, all questions answered. Signed her up for mychart.

## 2019-02-08 ENCOUNTER — Telehealth: Payer: Self-pay

## 2019-02-08 NOTE — Telephone Encounter (Signed)
Faxed COVID results to [place] [facility].; attn: Tammy @ [phone]

## 2019-08-03 ENCOUNTER — Ambulatory Visit (HOSPITAL_COMMUNITY): Admission: EM | Admit: 2019-08-03 | Discharge: 2019-08-03 | Discharge status: Against Medical Advice | Payer: Self-pay

## 2019-12-21 ENCOUNTER — Ambulatory Visit (HOSPITAL_COMMUNITY)
Admission: EM | Admit: 2019-12-21 | Discharge: 2019-12-21 | Disposition: A | Discharge status: Home / Self Care | Payer: 59 | Attending: Physician Assistant | Admitting: Physician Assistant

## 2019-12-21 DIAGNOSIS — R0789 Other chest pain: Secondary | ICD-10-CM | POA: Insufficient documentation

## 2019-12-21 LAB — COMPREHENSIVE METABOLIC PANEL
ALT: 13 U/L (ref 0–44)
AST: 24 U/L (ref 15–41)
Albumin: 4 g/dL (ref 3.5–5.0)
Alkaline Phosphatase: 52 U/L (ref 38–126)
Anion gap: 7 (ref 5–15)
BUN: 9 mg/dL (ref 6–20)
CO2: 25 mmol/L (ref 22–32)
Calcium: 8.9 mg/dL (ref 8.9–10.3)
Chloride: 106 mmol/L (ref 98–111)
Creatinine, Ser: 0.72 mg/dL (ref 0.44–1.00)
GFR calc Af Amer: >60 mL/min (ref >60)
GFR calc non Af Amer: >60 mL/min (ref >60)
Glucose, Bld: 93 mg/dL (ref 70–99)
Potassium: 4.2 mmol/L (ref 3.5–5.1)
Sodium: 138 mmol/L (ref 135–145)
Total Bilirubin: 0.5 mg/dL (ref 0.3–1.2)
Total Protein: 7.1 g/dL (ref 6.5–8.1)

## 2019-12-21 MED ORDER — FAMOTIDINE 20 MG PO TABS: 20.0000 mg | ORAL_TABLET | Freq: Two times a day (BID) | ORAL | 0 refills | Status: DC

## 2019-12-21 MED ORDER — DICLOFENAC SODIUM 1 % EX GEL: 4.0000 g | Freq: Four times a day (QID) | CUTANEOUS | 0 refills | Status: DC

## 2019-12-21 NOTE — ED Triage Notes (Signed)
Patient reports an acute flare up of chronic right sided scapula and rib cage pain. Reports this has happened intermittently over the last three years. This acute exacerbation started about one week ago.

## 2019-12-21 NOTE — ED Provider Notes (Signed)
[facility]    CSN: [id number] Arrival date & time: 12/21/19  1051      History   Chief Complaint Chief Complaint  Patient presents with  . Back Pain    HPI Ameena Vesey is a 29 y.o. female.   Patient presents for evaluation of right sided flank, chest and back pain.  She points to the area just underneath her right breast back to her mid thoracic back.  She describes the pain is burning.  She reports this is a 4/10.  She reports food does not change her pain, either improves or worsens.  She reports laying down improves the discomfort.  She reports sitting up and movement occasionally makes it worse.  She reports occasionally she will have the sensation with a deep breath.  Denies shortness of breath or chest pain otherwise.  She has not had fever, chills, nausea or vomiting.  There have been no rashes.  Denies pain in her belly.  Denies painful urination, frequency or urgency.  She denies wearing tight fitting bras or underwire bras.  Denies any change in diet recently.  She reports she had this issue previously over the years.  Most recent was 3 months ago and only lasted 2 days.  She reports that went away shortly.  She is concerned today because the pain she is currently experiencing has lasted about a week.  She has not tried anything for it.  She was never evaluated for it previously.  Per chart review she does have a history of reflux and pregnancy 5 years ago.  She denies issues with this recently.  Patient does not have a primary care.  [employer] interpreter was used for the duration of his visit.     Past Medical History:  Diagnosis Date  . Medical history non-contributory     Patient Active Problem List   Diagnosis Date Noted  . Active labor at term 09/15/2015    Past Surgical History:  Procedure Laterality Date  . NO PAST SURGERIES      OB History    Gravida  1   Para  1   Term      Preterm  1   AB      Living  1     SAB      TAB      Ectopic      Multiple  0   Live Births  1            Home Medications    Prior to Admission medications   Medication Sig Start Date End Date Taking? Authorizing Provider  cetirizine (ZYRTEC) 10 MG tablet Take 1 tablet (10 mg total) by mouth daily. 12/16/18   Cathie Hoops, Amy V, PA-C  diclofenac Sodium (VOLTAREN) 1 % GEL Apply 4 g topically 4 (four) times daily. 12/21/19   Doloras [place]ado, Veryl Speak, PA-C  famotidine (PEPCID) 20 MG tablet Take 1 tablet (20 mg total) by mouth 2 (two) times daily. 12/21/19   Elvis Laufer, Veryl Speak, PA-C  ibuprofen (ADVIL,MOTRIN) 600 MG tablet Take 1 tablet (600 mg total) by mouth every 6 (six) hours as needed for mild pain, moderate pain or cramping. 09/17/15   Palma Holter, MD  Prenatal Vit-Fe Fumarate-FA (PRENATAL VITAMIN) 27-0.8 MG TABS Take 1 tablet by mouth daily. 09/17/15   Palma Holter, MD  senna-docusate (SENOKOT-S) 8.6-50 MG tablet Take 1 tablet by mouth at bedtime. 09/17/15   Palma Holter, MD  triamcinolone cream (KENALOG) 0.1 % Apply  1 application topically 2 (two) times daily. 12/16/18   Ok Edwards, PA-C  valACYclovir (VALTREX) 1000 MG tablet Take 2 in tabs at first sign of cold sore, repeat dose in 12 hours 10/12/18   Raylene Everts, MD    Family History No family history on file.  Social History Social History   Tobacco Use  . Smoking status: Never Smoker  . Smokeless tobacco: Never Used  Substance Use Topics  . Alcohol use: No  . Drug use: No     Allergies   Patient has no known allergies.   Review of Systems Review of Systems   Physical Exam Triage Vital Signs ED Triage Vitals  Enc Vitals Group     BP 12/21/19 1122 120/78     Pulse Rate 12/21/19 1122 85     Resp 12/21/19 1122 12     Temp 12/21/19 1122 98.5 F (36.9 C)     Temp Source 12/21/19 1122 Oral     SpO2 12/21/19 1122 100 %     Weight --      Height --      Head Circumference --      Peak Flow --      Pain Score 12/21/19 1121 4     Pain Loc --      Pain Edu? --       Excl. in [place]? --    No data found.  Updated Vital Signs BP 120/78 (BP Location: Right Arm)   Pulse 85   Temp 98.5 F (36.9 C) (Oral)   Resp 12   LMP 12/14/2019 (Exact Date)   SpO2 100%   Visual Acuity Right Eye Distance:   Left Eye Distance:   Bilateral Distance:    Right Eye Near:   Left Eye Near:    Bilateral Near:     Physical Exam Vitals and nursing note reviewed.  Constitutional:      General: She is not in acute distress.    Appearance: Normal appearance. She is well-developed. She is not ill-appearing.  HENT:     Head: Normocephalic and atraumatic.  Eyes:     Conjunctiva/sclera: Conjunctivae normal.  Cardiovascular:     Rate and Rhythm: Normal rate and regular rhythm.     Heart sounds: No murmur.  Pulmonary:     Effort: Pulmonary effort is normal. No respiratory distress.     Breath sounds: Normal breath sounds. No wheezing, rhonchi or rales.  Abdominal:     General: There is no distension.     Palpations: Abdomen is soft. There is no mass.     Tenderness: There is no abdominal tenderness. There is no right CVA tenderness, left CVA tenderness, guarding or rebound.     Hernia: No hernia is present.  Musculoskeletal:     Cervical back: Neck supple.     Comments: Mildly at best tenderness from just below the right breast around the right side rib cage into them musculature the mid thoracic back.  There is no rash.  Patient has full range of motion of the upper extremity without elicited pain.  Skin:    General: Skin is warm and dry.  Neurological:     General: No focal deficit present.     Mental Status: She is alert and oriented to person, place, and time.      UC Treatments / Results  Labs (all labs ordered are listed, but only abnormal results are displayed) Labs Reviewed  COMPREHENSIVE METABOLIC PANEL  EKG   Radiology No results found.  Procedures Procedures (including critical care time)  Medications Ordered in UC Medications -  No data to display  Initial Impression / Assessment and Plan / UC Course  I have reviewed the triage vital signs and the nursing notes.  Pertinent labs & imaging results that were available during my care of the patient were reviewed by me and considered in my medical decision making (see chart for details).     #Right-sided chest wall pain Patient is a 29 year old female with right-sided chest wall/rib pain of unclear etiology.  There is no abdominal pain and no relation to food to suggest definitive biliary involvement.  Given history of GERD there is a possibility this is reflux/acid driven.  Will obtain a CMP and start on famotidine.  Also differential would be musculoskeletal pain, though not fully consistent here.  Will trial Voltaren gel over areas of tenderness and recommend loose fitting clothing.  Lung sounds are clear and taking deep inspiration without issue, so doubt diaphragmatic or pulmonary issue patient is to follow-up with primary care for continued evaluation.  Return and follow-up precautions were discussed. Final Clinical Impressions(s) / UC Diagnoses   Final diagnoses:  Right-sided chest wall pain     Discharge Instructions     I have sent basic labs and will notify you of any findings requiring discussion of treatment  I want you to begin the famotidine/Pepcid twice a day As well apply the Voltaren to the areas of pain up to 4 times a day Wear looser fitting close in this area  Please follow-up and establish care with the primary care office supplied  If prior to being able to be seen you have significant increase in your pain or worsening symptoms associated with this please return for reevaluation.      ED Prescriptions    Medication Sig Dispense Auth. Provider   famotidine (PEPCID) 20 MG tablet Take 1 tablet (20 mg total) by mouth 2 (two) times daily. 30 tablet Jahrel Borthwick, Veryl Speak, PA-C   diclofenac Sodium (VOLTAREN) 1 % GEL Apply 4 g topically 4 (four) times  daily. 50 g Nikolas Casher, Veryl Speak, PA-C     PDMP not reviewed this encounter.   Hermelinda Medicus, PA-C 12/21/19 1322

## 2019-12-21 NOTE — Discharge Instructions (Signed)
I have sent basic labs and will notify you of any findings requiring discussion of treatment  I want you to begin the famotidine/Pepcid twice a day As well apply the Voltaren to the areas of pain up to 4 times a day Wear looser fitting close in this area  Please follow-up and establish care with the primary care office supplied  If prior to being able to be seen you have significant increase in your pain or worsening symptoms associated with this please return for reevaluation.

## 2020-01-04 ENCOUNTER — Ambulatory Visit: Payer: 59

## 2020-01-11 ENCOUNTER — Ambulatory Visit: Payer: 59

## 2020-01-11 ENCOUNTER — Telehealth: Payer: Self-pay | Admitting: *Deleted

## 2020-01-11 NOTE — Telephone Encounter (Signed)
Call to patient about missed appointment today.  Patient stated is at an appointment now with her child.   Patient to call to reschedule appointment.  Angelina Ok, RN 01/11/2020 9:50 AM.

## 2020-01-12 ENCOUNTER — Encounter: Payer: Self-pay | Admitting: General Practice

## 2020-01-13 ENCOUNTER — Ambulatory Visit: Payer: 59

## 2020-02-21 ENCOUNTER — Other Ambulatory Visit: Payer: Self-pay

## 2020-02-21 ENCOUNTER — Emergency Department (HOSPITAL_COMMUNITY)
Admission: EM | Admit: 2020-02-21 | Discharge: 2020-02-21 | Disposition: A | Discharge status: Home / Self Care | Payer: 59 | Attending: Emergency Medicine | Admitting: Emergency Medicine

## 2020-02-21 ENCOUNTER — Encounter (HOSPITAL_COMMUNITY): Payer: Self-pay

## 2020-02-21 DIAGNOSIS — K219 Gastro-esophageal reflux disease without esophagitis: Secondary | ICD-10-CM | POA: Diagnosis not present

## 2020-02-21 DIAGNOSIS — R110 Nausea: Secondary | ICD-10-CM | POA: Diagnosis not present

## 2020-02-21 DIAGNOSIS — Z79899 Other long term (current) drug therapy: Secondary | ICD-10-CM | POA: Diagnosis not present

## 2020-02-21 DIAGNOSIS — R1013 Epigastric pain: Secondary | ICD-10-CM | POA: Diagnosis present

## 2020-02-21 LAB — CBC
HCT: 34.4 % — ABNORMAL LOW (ref 36.0–46.0)
Hemoglobin: 10.4 g/dL — ABNORMAL LOW (ref 12.0–15.0)
MCH: 21.2 pg — ABNORMAL LOW (ref 26.0–34.0)
MCHC: 30.2 g/dL (ref 30.0–36.0)
MCV: 70.2 fL — ABNORMAL LOW (ref 80.0–100.0)
Platelets: 206 10*3/uL (ref 150–400)
RBC: 4.9 MIL/uL (ref 3.87–5.11)
RDW: 15.3 % (ref 11.5–15.5)
WBC: 5.5 10*3/uL (ref 4.0–10.5)
nRBC: 0 % (ref 0.0–0.2)

## 2020-02-21 LAB — COMPREHENSIVE METABOLIC PANEL
ALT: 11 U/L (ref 0–44)
AST: 19 U/L (ref 15–41)
Albumin: 3.7 g/dL (ref 3.5–5.0)
Alkaline Phosphatase: 39 U/L (ref 38–126)
Anion gap: 8 (ref 5–15)
BUN: 9 mg/dL (ref 6–20)
CO2: 24 mmol/L (ref 22–32)
Calcium: 8.8 mg/dL — ABNORMAL LOW (ref 8.9–10.3)
Chloride: 106 mmol/L (ref 98–111)
Creatinine, Ser: 0.77 mg/dL (ref 0.44–1.00)
GFR calc Af Amer: >60 mL/min (ref >60)
GFR calc non Af Amer: >60 mL/min (ref >60)
Glucose, Bld: 80 mg/dL (ref 70–99)
Potassium: 4 mmol/L (ref 3.5–5.1)
Sodium: 138 mmol/L (ref 135–145)
Total Bilirubin: 0.8 mg/dL (ref 0.3–1.2)
Total Protein: 6.8 g/dL (ref 6.5–8.1)

## 2020-02-21 LAB — I-STAT BETA HCG BLOOD, ED (MC, WL, AP ONLY): I-stat hCG, quantitative: <5 m[IU]/mL (ref <5)

## 2020-02-21 LAB — LIPASE, BLOOD: Lipase: 24 U/L (ref 11–51)

## 2020-02-21 MED ORDER — ALUM & MAG HYDROXIDE-SIMETH 200-200-20 MG/5ML PO SUSP: 30.0000 mL | Freq: Once | ORAL | Status: DC

## 2020-02-21 MED ORDER — SUCRALFATE 1 G PO TABS: 1.0000 g | ORAL_TABLET | Freq: Three times a day (TID) | ORAL | Status: DC

## 2020-02-21 MED ORDER — SUCRALFATE 1 G PO TABS
1.0000 g | ORAL_TABLET | Freq: Four times a day (QID) | ORAL | 0 refills | Status: DC
Start: 2020-02-21 — End: 2021-12-10

## 2020-02-21 MED ORDER — LIDOCAINE VISCOUS HCL 2 % MT SOLN: 15.0000 mL | Freq: Once | OROMUCOSAL | Status: DC

## 2020-02-21 NOTE — ED Provider Notes (Signed)
[facility] EMERGENCY DEPARTMENT Provider Note   CSN: [id number] Arrival date & time: 02/21/20  6160     History Chief Complaint  Patient presents with  . Abdominal Pain    Wendy Gay is a 29 y.o. female.  29 year old female presents with epigastric pain that is worse after eating spicy food.  Has had nausea but no vomiting.  No fever or chills.  Was seen a week ago for similar symptoms and prescribed Protonix without relief.  Denies any black or bloody stools.  Pain is not made worse with swallowing.        Past Medical History:  Diagnosis Date  . Medical history non-contributory     Patient Active Problem List   Diagnosis Date Noted  . Active labor at term 09/15/2015    Past Surgical History:  Procedure Laterality Date  . NO PAST SURGERIES       OB History    Gravida  1   Para  1   Term      Preterm  1   AB      Living  1     SAB      TAB      Ectopic      Multiple  0   Live Births  1           No family history on file.  Social History   Tobacco Use  . Smoking status: Never Smoker  . Smokeless tobacco: Never Used  Vaping Use  . Vaping Use: Never used  Substance Use Topics  . Alcohol use: No  . Drug use: No    Home Medications Prior to Admission medications   Medication Sig Start Date End Date Taking? Authorizing Provider  cetirizine (ZYRTEC) 10 MG tablet Take 1 tablet (10 mg total) by mouth daily. 12/16/18   Cathie Hoops, Amy V, PA-C  diclofenac Sodium (VOLTAREN) 1 % GEL Apply 4 g topically 4 (four) times daily. 12/21/19   Darr, Veryl Speak, PA-C  famotidine (PEPCID) 20 MG tablet Take 1 tablet (20 mg total) by mouth 2 (two) times daily. 12/21/19   Darr, Veryl Speak, PA-C  ibuprofen (ADVIL,MOTRIN) 600 MG tablet Take 1 tablet (600 mg total) by mouth every 6 (six) hours as needed for mild pain, moderate pain or cramping. 09/17/15   Palma Holter, MD  omeprazole (PRILOSEC) 20 MG capsule Take 20 mg by mouth daily. 02/18/20   [provider]  Prenatal Vit-Fe Fumarate-FA (PRENATAL VITAMIN) 27-0.8 MG TABS Take 1 tablet by mouth daily. 09/17/15   Palma Holter, MD  senna-docusate (SENOKOT-S) 8.6-50 MG tablet Take 1 tablet by mouth at bedtime. 09/17/15   Palma Holter, MD  triamcinolone cream (KENALOG) 0.1 % Apply 1 application topically 2 (two) times daily. 12/16/18   Belinda Fisher, PA-C  valACYclovir (VALTREX) 1000 MG tablet Take 2 in tabs at first sign of cold sore, repeat dose in 12 hours 10/12/18   Eustace Moore, MD    Allergies    Patient has no known allergies.  Review of Systems   Review of Systems  All other systems reviewed and are negative.   Physical Exam Updated Vital Signs BP 123/83 (BP Location: Right Arm)   Pulse 87   Temp 97.8 F (36.6 C) (Oral)   Resp 20   LMP 01/20/2020   SpO2 100%   Physical Exam Vitals and nursing note reviewed.  Constitutional:      General: She is not  in acute distress.    Appearance: Normal appearance. She is well-developed. She is not toxic-appearing.  HENT:     Head: Normocephalic and atraumatic.  Eyes:     General: Lids are normal.     Conjunctiva/sclera: Conjunctivae normal.     Pupils: Pupils are equal, round, and reactive to light.  Neck:     Thyroid: No thyroid mass.     Trachea: No tracheal deviation.  Cardiovascular:     Rate and Rhythm: Normal rate and regular rhythm.     Heart sounds: Normal heart sounds. No murmur heard.  No gallop.   Pulmonary:     Effort: Pulmonary effort is normal. No respiratory distress.     Breath sounds: Normal breath sounds. No stridor. No decreased breath sounds, wheezing, rhonchi or rales.  Abdominal:     General: Bowel sounds are normal. There is no distension.     Palpations: Abdomen is soft.     Tenderness: There is abdominal tenderness in the epigastric area. There is no rebound.    Musculoskeletal:        General: No tenderness. Normal range of motion.     Cervical back: Normal range of motion  and neck supple.  Skin:    General: Skin is warm and dry.     Findings: No abrasion or rash.  Neurological:     Mental Status: She is alert and oriented to person, place, and time.     GCS: GCS eye subscore is 4. GCS verbal subscore is 5. GCS motor subscore is 6.     Cranial Nerves: No cranial nerve deficit.     Sensory: No sensory deficit.  Psychiatric:        Speech: Speech normal.        Behavior: Behavior normal.     ED Results / Procedures / Treatments   Labs (all labs ordered are listed, but only abnormal results are displayed) Labs Reviewed  CBC - Abnormal; Notable for the following components:      Result Value   Hemoglobin 10.4 (*)    HCT 34.4 (*)    MCV 70.2 (*)    MCH 21.2 (*)    All other components within normal limits  LIPASE, BLOOD  COMPREHENSIVE METABOLIC PANEL  I-STAT BETA HCG BLOOD, ED ([facility], [facility], [facility] ONLY)    EKG None  Radiology No results found.  Procedures Procedures (including critical care time)  Medications Ordered in ED Medications  sucralfate (CARAFATE) tablet 1 g (has no administration in time range)  alum & mag hydroxide-simeth (MAALOX/MYLANTA) 200-200-20 MG/5ML suspension 30 mL (has no administration in time range)    And  lidocaine (XYLOCAINE) 2 % viscous mouth solution 15 mL (has no administration in time range)    ED Course  I have reviewed the triage vital signs and the nursing notes.  Pertinent labs & imaging results that were available during my care of the patient were reviewed by me and considered in my medical decision making (see chart for details).    MDM Rules/Calculators/A&P                          Interpreter used for this conversation.  Patient given GI cocktail and Pepcid and Carafate and feels better.  Suspect GERD versus esophagitis.  Will discharge home Final Clinical Impression(s) / ED Diagnoses Final diagnoses:  None    Rx / DC Orders ED Discharge Orders    None  Lorre Nick, MD 02/21/20  [phone]

## 2020-02-21 NOTE — ED Triage Notes (Addendum)
[place] interpreter used for triage:   Pt c.o epigastric pain since last week, describes as burning. No n/v. Pt taking omeprazole without relief.

## 2020-10-23 ENCOUNTER — Emergency Department (HOSPITAL_COMMUNITY)
Admission: EM | Admit: 2020-10-23 | Discharge: 2020-10-24 | Disposition: A | Discharge status: Home / Self Care | Payer: 59 | Attending: Emergency Medicine | Admitting: Emergency Medicine

## 2020-10-23 ENCOUNTER — Other Ambulatory Visit: Payer: Self-pay

## 2020-10-23 ENCOUNTER — Encounter (HOSPITAL_COMMUNITY): Payer: Self-pay

## 2020-10-23 DIAGNOSIS — R1013 Epigastric pain: Secondary | ICD-10-CM | POA: Diagnosis present

## 2020-10-23 DIAGNOSIS — N72 Inflammatory disease of cervix uteri: Secondary | ICD-10-CM | POA: Insufficient documentation

## 2020-10-23 LAB — CBC
HCT: 35.9 % — ABNORMAL LOW (ref 36.0–46.0)
Hemoglobin: 11.5 g/dL — ABNORMAL LOW (ref 12.0–15.0)
MCH: 23 pg — ABNORMAL LOW (ref 26.0–34.0)
MCHC: 32 g/dL (ref 30.0–36.0)
MCV: 71.7 fL — ABNORMAL LOW (ref 80.0–100.0)
Platelets: 191 10*3/uL (ref 150–400)
RBC: 5.01 MIL/uL (ref 3.87–5.11)
RDW: 14.5 % (ref 11.5–15.5)
WBC: 16.9 10*3/uL — ABNORMAL HIGH (ref 4.0–10.5)
nRBC: 0 % (ref 0.0–0.2)

## 2020-10-23 LAB — COMPREHENSIVE METABOLIC PANEL
ALT: 14 U/L (ref 0–44)
AST: 22 U/L (ref 15–41)
Albumin: 4.1 g/dL (ref 3.5–5.0)
Alkaline Phosphatase: 45 U/L (ref 38–126)
Anion gap: 10 (ref 5–15)
BUN: 17 mg/dL (ref 6–20)
CO2: 21 mmol/L — ABNORMAL LOW (ref 22–32)
Calcium: 9.3 mg/dL (ref 8.9–10.3)
Chloride: 104 mmol/L (ref 98–111)
Creatinine, Ser: 0.72 mg/dL (ref 0.44–1.00)
GFR, Estimated: >60 mL/min (ref >60)
Glucose, Bld: 85 mg/dL (ref 70–99)
Potassium: 3.6 mmol/L (ref 3.5–5.1)
Sodium: 135 mmol/L (ref 135–145)
Total Bilirubin: 0.7 mg/dL (ref 0.3–1.2)
Total Protein: 7.4 g/dL (ref 6.5–8.1)

## 2020-10-23 LAB — URINALYSIS, ROUTINE W REFLEX MICROSCOPIC
Bilirubin Urine: NEGATIVE
Glucose, UA: NEGATIVE mg/dL
Hgb urine dipstick: NEGATIVE
Ketones, ur: 20 mg/dL — AB
Leukocytes,Ua: NEGATIVE
Nitrite: NEGATIVE
Protein, ur: NEGATIVE mg/dL
Specific Gravity, Urine: 1.018 (ref 1.005–1.030)
pH: 6 (ref 5.0–8.0)

## 2020-10-23 LAB — LIPASE, BLOOD: Lipase: 27 U/L (ref 11–51)

## 2020-10-23 LAB — I-STAT BETA HCG BLOOD, ED (MC, WL, AP ONLY): I-stat hCG, quantitative: <5 m[IU]/mL (ref <5)

## 2020-10-23 MED ORDER — LACTATED RINGERS IV BOLUS
1000.0000 mL | Freq: Once | INTRAVENOUS | Status: AC
  Administered 2020-10-24: 1000 mL via INTRAVENOUS

## 2020-10-23 MED ORDER — HYDROMORPHONE HCL 1 MG/ML IJ SOLN
0.5000 mg | Freq: Once | INTRAMUSCULAR | Status: AC
  Administered 2020-10-24: 0.5 mg via INTRAVENOUS
  Filled 2020-10-23: qty 1

## 2020-10-23 NOTE — ED Triage Notes (Signed)
Pt reports lower abd pain that has been going on all day, diarrhea for the past few days, denies dysuria.

## 2020-10-24 ENCOUNTER — Emergency Department (HOSPITAL_COMMUNITY): Payer: 59

## 2020-10-24 LAB — WET PREP, GENITAL
Clue Cells Wet Prep HPF POC: NONE SEEN
Sperm: NONE SEEN
Trich, Wet Prep: NONE SEEN

## 2020-10-24 LAB — GC/CHLAMYDIA PROBE AMP (~~LOC~~) NOT AT ARMC
Chlamydia: NEGATIVE
Comment: NEGATIVE
Comment: NORMAL
Neisseria Gonorrhea: NEGATIVE

## 2020-10-24 MED ORDER — CEFTRIAXONE SODIUM 500 MG IJ SOLR
500.0000 mg | Freq: Once | INTRAMUSCULAR | Status: AC
  Administered 2020-10-24: 500 mg via INTRAMUSCULAR
  Filled 2020-10-24: qty 500

## 2020-10-24 MED ORDER — IOHEXOL 300 MG/ML  SOLN
80.0000 mL | Freq: Once | INTRAMUSCULAR | Status: AC | PRN
  Administered 2020-10-24: 80 mL via INTRAVENOUS

## 2020-10-24 MED ORDER — DOXYCYCLINE HYCLATE 100 MG PO CAPS
100.0000 mg | ORAL_CAPSULE | Freq: Two times a day (BID) | ORAL | 0 refills | Status: DC
Start: 2020-10-24 — End: 2021-12-10

## 2020-10-24 MED ORDER — FLUCONAZOLE 200 MG PO TABS
200.0000 mg | ORAL_TABLET | Freq: Once | ORAL | Status: AC
  Administered 2020-10-24: 200 mg via ORAL
  Filled 2020-10-24: qty 1

## 2020-10-24 MED ORDER — LIDOCAINE HCL (PF) 1 % IJ SOLN
INTRAMUSCULAR | Status: AC
  Filled 2020-10-24: qty 5

## 2020-10-24 MED ORDER — DOXYCYCLINE HYCLATE 100 MG PO TABS
100.0000 mg | ORAL_TABLET | Freq: Once | ORAL | Status: AC
  Administered 2020-10-24: 100 mg via ORAL
  Filled 2020-10-24: qty 1

## 2020-10-24 NOTE — ED Provider Notes (Signed)
[facility] EMERGENCY DEPARTMENT Provider Note   CSN: [id number] Arrival date & time: 10/23/20  1840     History Chief Complaint  Patient presents with  . Abdominal Pain    Wendy Gay is a 30 y.o. female.   Abdominal Pain Pain location:  Epigastric and suprapubic Pain quality: sharp and shooting   Pain radiates to:  Does not radiate Pain severity:  Mild Timing:  Constant Chronicity:  New Context: not alcohol use, not awakening from sleep and not eating   Relieved by:  None tried Worsened by:  Nothing Ineffective treatments:  None tried      Past Medical History:  Diagnosis Date  . Medical history non-contributory     Patient Active Problem List   Diagnosis Date Noted  . Active labor at term 09/15/2015    Past Surgical History:  Procedure Laterality Date  . NO PAST SURGERIES       OB History    Gravida  1   Para  1   Term      Preterm  1   AB      Living  1     SAB      IAB      Ectopic      Multiple  0   Live Births  1           No family history on file.  Social History   Tobacco Use  . Smoking status: Never Smoker  . Smokeless tobacco: Never Used  Vaping Use  . Vaping Use: Never used  Substance Use Topics  . Alcohol use: No  . Drug use: No    Home Medications Prior to Admission medications   Medication Sig Start Date End Date Taking? Authorizing Provider  doxycycline (VIBRAMYCIN) 100 MG capsule Take 1 capsule (100 mg total) by mouth 2 (two) times daily. One po bid x 7 days 10/24/20  Yes Mesner, Barbara Cower, MD  cetirizine (ZYRTEC) 10 MG tablet Take 1 tablet (10 mg total) by mouth daily. Patient not taking: Reported on 02/21/2020 12/16/18   Belinda Fisher, PA-C  diclofenac Sodium (VOLTAREN) 1 % GEL Apply 4 g topically 4 (four) times daily. Patient not taking: Reported on 02/21/2020 12/21/19   Darr, Gerilyn Pilgrim, PA-C  famotidine (PEPCID) 20 MG tablet Take 1 tablet (20 mg total) by mouth 2 (two) times daily. Patient not taking:  Reported on 02/21/2020 12/21/19   Darr, Gerilyn Pilgrim, PA-C  ibuprofen (ADVIL,MOTRIN) 600 MG tablet Take 1 tablet (600 mg total) by mouth every 6 (six) hours as needed for mild pain, moderate pain or cramping. Patient not taking: Reported on 02/21/2020 09/17/15   Palma Holter, MD  omeprazole (PRILOSEC) 20 MG capsule Take 20 mg by mouth daily. 02/18/20   [provider]  Prenatal Vit-Fe Fumarate-FA (PRENATAL VITAMIN) 27-0.8 MG TABS Take 1 tablet by mouth daily. Patient not taking: Reported on 02/21/2020 09/17/15   Palma Holter, MD  senna-docusate (SENOKOT-S) 8.6-50 MG tablet Take 1 tablet by mouth at bedtime. Patient not taking: Reported on 02/21/2020 09/17/15   Palma Holter, MD  sucralfate (CARAFATE) 1 g tablet Take 1 tablet (1 g total) by mouth 4 (four) times daily. 02/21/20   Lorre Nick, MD  triamcinolone cream (KENALOG) 0.1 % Apply 1 application topically 2 (two) times daily. Patient not taking: Reported on 02/21/2020 12/16/18   Belinda Fisher, PA-C  valACYclovir (VALTREX) 1000 MG tablet Take 2 in tabs at first sign of cold sore,  repeat dose in 12 hours Patient not taking: Reported on 02/21/2020 10/12/18   Eustace Moore, MD    Allergies    Patient has no known allergies.  Review of Systems   Review of Systems  Gastrointestinal: Positive for abdominal pain.  All other systems reviewed and are negative.   Physical Exam Updated Vital Signs BP (!) 91/58   Pulse 84   Temp 99.1 F (37.3 C) (Oral)   Resp 18   SpO2 100%   Physical Exam Vitals and nursing note reviewed. Exam conducted with a chaperone present.  Constitutional:      Appearance: She is well-developed and well-nourished.  HENT:     Head: Normocephalic and atraumatic.     Nose: Nose normal. No congestion or rhinorrhea.     Mouth/Throat:     Mouth: Mucous membranes are moist.     Pharynx: Oropharynx is clear.  Eyes:     Pupils: Pupils are equal, round, and reactive to light.  Cardiovascular:     Rate and  Rhythm: Normal rate and regular rhythm.  Pulmonary:     Effort: No respiratory distress.     Breath sounds: No stridor.  Abdominal:     General: Abdomen is flat. There is no distension.  Genitourinary:    General: Normal vulva.     Cervix: Cervical motion tenderness, discharge, friability and erythema present.     Rectum: Normal.     Comments: Chaperoned by nurse Meagan Musculoskeletal:        General: No swelling or tenderness. Normal range of motion.     Cervical back: Normal range of motion.  Skin:    General: Skin is warm and dry.  Neurological:     General: No focal deficit present.     Mental Status: She is alert.     ED Results / Procedures / Treatments   Labs (all labs ordered are listed, but only abnormal results are displayed) Labs Reviewed  WET PREP, GENITAL - Abnormal; Notable for the following components:      Result Value   Yeast Wet Prep HPF POC PRESENT (*)    WBC, Wet Prep HPF POC MANY (*)    All other components within normal limits  COMPREHENSIVE METABOLIC PANEL - Abnormal; Notable for the following components:   CO2 21 (*)    All other components within normal limits  CBC - Abnormal; Notable for the following components:   WBC 16.9 (*)    Hemoglobin 11.5 (*)    HCT 35.9 (*)    MCV 71.7 (*)    MCH 23.0 (*)    All other components within normal limits  URINALYSIS, ROUTINE W REFLEX MICROSCOPIC - Abnormal; Notable for the following components:   Ketones, ur 20 (*)    All other components within normal limits  LIPASE, BLOOD  I-STAT BETA HCG BLOOD, ED ([facility], [facility], [facility] ONLY)  WET PREP  (BD AFFIRM) ([place])  GC/CHLAMYDIA PROBE AMP ([place]) NOT AT [facility]    EKG None  Radiology CT ABDOMEN PELVIS W CONTRAST  Result Date: 10/24/2020 CLINICAL DATA:  Lower abdominal pain, diarrhea EXAM: CT ABDOMEN AND PELVIS WITH CONTRAST TECHNIQUE: Multidetector CT imaging of the abdomen and pelvis was performed using the standard protocol following bolus administration  of intravenous contrast. CONTRAST:  29mL OMNIPAQUE IOHEXOL 300 MG/ML  SOLN COMPARISON:  None. FINDINGS: Lower chest: The visualized lung bases are clear bilaterally. The visualized heart and pericardium are unremarkable. Hepatobiliary: No focal liver abnormality is seen.  No gallstones, gallbladder wall thickening, or biliary dilatation. Pancreas: Unremarkable. No pancreatic ductal dilatation or surrounding inflammatory changes. Spleen: Unremarkable Adrenals/Urinary Tract: Adrenal glands are unremarkable. Kidneys are normal, without renal calculi, focal lesion, or hydronephrosis. Bladder is unremarkable. Stomach/Bowel: The stomach, small bowel, and large bowel are unremarkable. Appendix normal. No free intraperitoneal gas or fluid. Vascular/Lymphatic: No significant vascular findings are present. No enlarged abdominal or pelvic lymph nodes. Reproductive: There is prominent endometrial enhancement as well as a trace amount of fluid within the endometrial cavity. There is, additionally, relative hypoenhancement of the myometrium which may relate to edema. Together, these findings may relate to endometritis in the setting of pelvic inflammatory disease. Mild haziness of the pelvic fat may relate to inflammatory change. Corpus luteum noted within the left ovary. No adnexal masses are seen. 2.7 cm simple appearing cyst noted within the left ovary. Other: No abdominal wall hernia.  Rectum unremarkable. Musculoskeletal: No acute bone abnormality. IMPRESSION: Prominent enhancement of the endometrium, trace fluid within the endometrial cavity, and abnormal enhancement of the myometrium possibly related to edema all suggestive of endometritis in the setting of early pelvic inflammatory disease. Electronically Signed   By: Helyn Numbers MD   On: 10/24/2020 01:05    Procedures Procedures   Medications Ordered in ED Medications  lactated ringers bolus 1,000 mL (0 mLs Intravenous Stopped 10/24/20 0237)  HYDROmorphone  (DILAUDID) injection 0.5 mg (0.5 mg Intravenous Given 10/24/20 0023)  iohexol (OMNIPAQUE) 300 MG/ML solution 80 mL (80 mLs Intravenous Contrast Given 10/24/20 0040)  cefTRIAXone (ROCEPHIN) injection 500 mg (500 mg Intramuscular Given 10/24/20 0331)  doxycycline (VIBRA-TABS) tablet 100 mg (100 mg Oral Given 10/24/20 0330)  fluconazole (DIFLUCAN) tablet 200 mg (200 mg Oral Given 10/24/20 0354)  lidocaine (PF) (XYLOCAINE) 1 % injection (  Given 10/24/20 0345)    ED Course  I have reviewed the triage vital signs and the nursing notes.  Pertinent labs & imaging results that were available during my care of the patient were reviewed by me and considered in my medical decision making (see chart for details).    MDM Rules/Calculators/A&P                          Cervicitis with discharge and CT scans c/w similar. Swabs obtained. Treated for GC/Chlam and yeast. Gyn follow up suggested. abstinence suggested.   Final Clinical Impression(s) / ED Diagnoses Final diagnoses:  Cervicitis    Rx / DC Orders ED Discharge Orders         Ordered    doxycycline (VIBRAMYCIN) 100 MG capsule  2 times daily        10/24/20 0320           Mesner, Barbara Cower, MD 10/24/20 [phone]

## 2021-10-09 ENCOUNTER — Other Ambulatory Visit: Payer: Self-pay

## 2021-10-09 ENCOUNTER — Encounter: Payer: 59 | Admitting: Radiology

## 2021-10-23 NOTE — Progress Notes (Signed)
? ?  Wendy Gay August 18, 1991 [id number] ? ? ?History:  31 y.o. G1P1 presents for annual exam. Here with her friend, [place] interpreter [employer]. She c/o right breast pain before her menses for the past 6 years. ? ?Gynecologic History ?Patient's last menstrual period was 10/21/2021. ?Period Cycle (Days): 28 ?Period Duration (Days): 4 ?Period Pattern: Regular ?Menstrual Flow: Light ?Dysmenorrhea: None ?Contraception/Family planning: condoms ?Sexually active: yes, last intercourse 1 month ago ?Last Pap: 2017. Results were: normal ? ?Obstetric History ?OB History  ?Gravida Para Term Preterm AB Living  ?1 1   1   1   ?SAB IAB Ectopic Multiple Live Births  ?      0 1  ?  ?# Outcome Date GA Lbr Len/2nd Weight Sex Delivery Anes PTL Lv  ?1 Preterm 09/15/15 [redacted]w[redacted]d 05:35 / 00:26 5 lb 2.2 oz (2.33 kg) F Vag-Spont None  LIV  ? ? ? ?The following portions of the patient's history were reviewed and updated as appropriate: allergies, current medications, past family history, past medical history, past social history, past surgical history, and problem list. ? ?Review of Systems ?Pertinent items noted in HPI and remainder of comprehensive ROS otherwise negative.  ? ?Past medical history, past surgical history, family history and social history were all reviewed and documented in the EPIC chart. ? ? ?Exam: ? ?Vitals:  ? 10/24/21 0828  ?BP: 114/72  ?Weight: 104 lb (47.2 kg)  ?Height: 5\' 2"  (1.575 m)  ? ?Body mass index is 19.02 kg/m?. ? ?General appearance:  Normal ?Thyroid:  Symmetrical, normal in size, without palpable masses or nodularity. ?Respiratory ? Auscultation:  Clear without wheezing or rhonchi ?Cardiovascular ? Auscultation:  Regular rate, without rubs, murmurs or gallops ? Edema/varicosities:  Not grossly evident ?Abdominal ? Soft,nontender, without masses, guarding or rebound. ? Liver/spleen:  No organomegaly noted ? Hernia:  None appreciated ? Skin ? Inspection:  Grossly normal ?Breasts: Examined lying and  sitting.  ? Right: Without masses, retractions, nipple discharge or axillary adenopathy. ? ? Left: Without masses, retractions, nipple discharge or axillary adenopathy. ?Genitourinary  ? Inguinal/mons:  Normal without inguinal adenopathy ? External genitalia:  Normal appearing vulva with no masses, tenderness, or lesions ? BUS/Urethra/Skene's glands:  Normal without masses or exudate ? Vagina:  Normal appearing with normal color and discharge, no lesions ? Cervix:  Normal appearing without discharge or lesions ? Uterus:  Normal in size, shape and contour.  Mobile, nontender ? Adnexa/parametria:   ?  Rt: Normal in size, without masses or tenderness. ?  Lt: Normal in size, without masses or tenderness. ? Anus and perineum: Normal ?  ? ?Patient informed chaperone available to be present for breast and pelvic exam. Patient has requested no chaperone to be present. Patient has been advised what will be completed during breast and pelvic exam.  ? ?Assessment/Plan:   ?1. Well woman exam with routine gynecological exam ?Reassured normal exam. Will contact with results of pap ?Declines hormonal BC ?- Cytology - PAP( [facility]) ? ?2. Cyclical breast pain ?Avoid caffeine before menses, may try vit E 12/24/21 BID prn  ? ?Discussed SBE, pap and STI screening as directed/appropriate. Recommend of exercise weekly, including weight bearing exercise. Encouraged the use of seatbelts and sunscreen. ?Return in 1 year for annual or as needed.  ? ? B WHNP-BC 9:00 AM 10/24/2021  ?

## 2021-10-24 ENCOUNTER — Other Ambulatory Visit (HOSPITAL_COMMUNITY)
Admission: RE | Admit: 2021-10-24 | Discharge: 2021-10-24 | Disposition: A | Discharge status: Home / Self Care | Payer: Self-pay | Source: Ambulatory Visit | Attending: Radiology | Admitting: Radiology

## 2021-10-24 ENCOUNTER — Encounter: Payer: Self-pay | Admitting: Radiology

## 2021-10-24 ENCOUNTER — Ambulatory Visit (INDEPENDENT_AMBULATORY_CARE_PROVIDER_SITE_OTHER): Payer: 59 | Admitting: Radiology

## 2021-10-24 ENCOUNTER — Other Ambulatory Visit: Payer: Self-pay

## 2021-10-24 VITALS — BP 114/72 | Ht 62.0 in | Wt 104.0 lb

## 2021-10-24 DIAGNOSIS — N644 Mastodynia: Secondary | ICD-10-CM | POA: Diagnosis not present

## 2021-10-24 DIAGNOSIS — Z01419 Encounter for gynecological examination (general) (routine) without abnormal findings: Secondary | ICD-10-CM

## 2021-10-25 LAB — CYTOLOGY - PAP
Chlamydia: NEGATIVE
Comment: NEGATIVE
Comment: NEGATIVE
Comment: NORMAL
Diagnosis: NEGATIVE
Neisseria Gonorrhea: NEGATIVE
Trichomonas: NEGATIVE

## 2021-12-14 ENCOUNTER — Encounter: Payer: Self-pay | Admitting: Obstetrics and Gynecology

## 2021-12-14 DIAGNOSIS — Z01419 Encounter for gynecological examination (general) (routine) without abnormal findings: Secondary | ICD-10-CM

## 2021-12-31 IMAGING — CT CT ABD-PELV W/ CM
3 of 4 series · 11 of 46 positions shown, 16 images · IV contrast (APPLIED)
Comparison: None.

CLINICAL DATA: Lower abdominal pain, diarrhea

EXAM:
CT ABDOMEN AND PELVIS WITH CONTRAST
TECHNIQUE: Multidetector CT imaging of the abdomen and pelvis was performed
using the standard protocol following bolus administration of
intravenous contrast.
CONTRAST:  80mL OMNIPAQUE IOHEXOL 300 MG/ML  SOLN

[Series 3: abdomen 5.0 · axial · 0.62mm/px · z∈[+1004,+1304]mm · 7 of 82 slices shown, 12 images]
[im 11/82  soft-tissue]
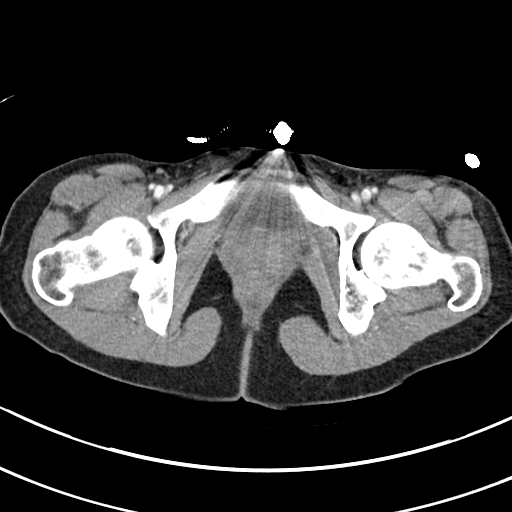
[im 11/82  bone]
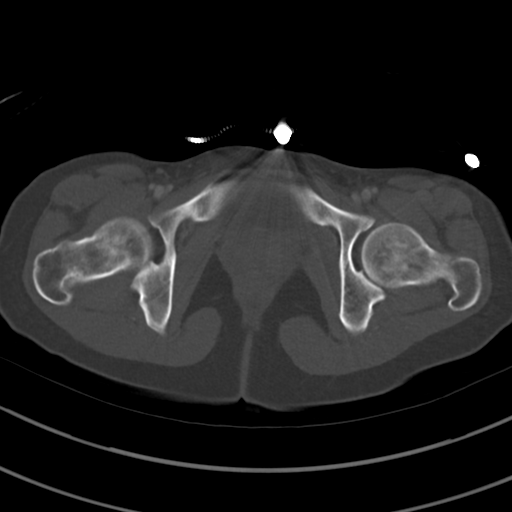
[im 21/82  soft-tissue]
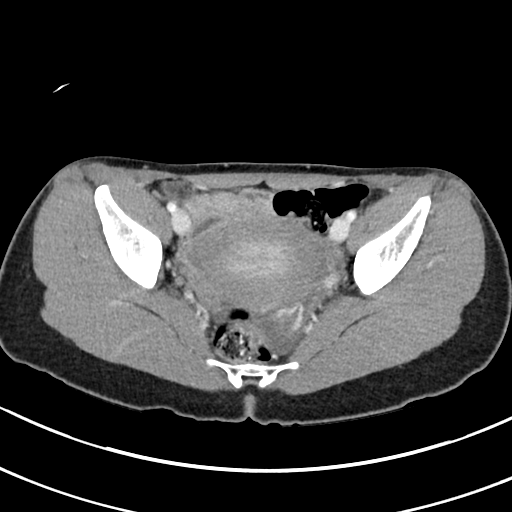
[im 31/82  soft-tissue]
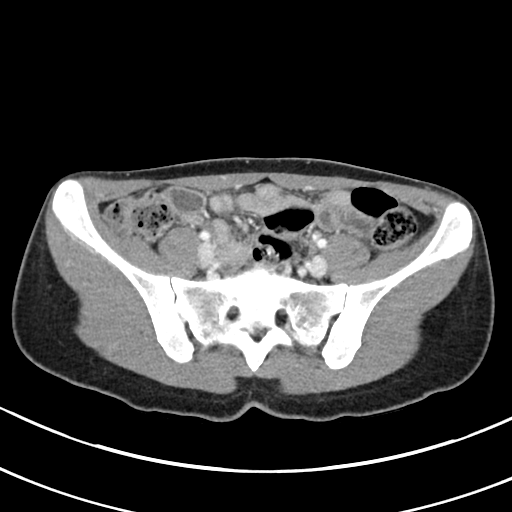
[im 41/82  soft-tissue]
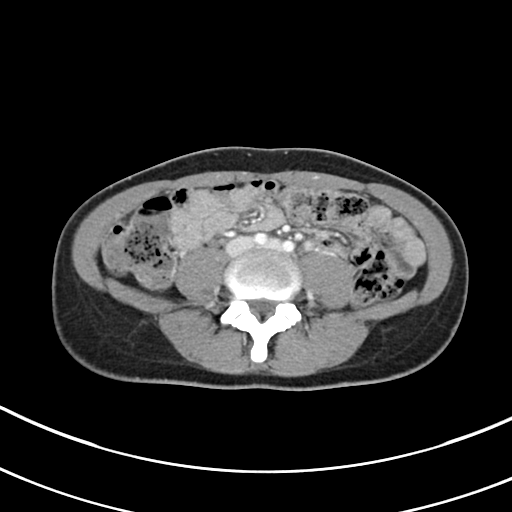
[im 41/82  lung]
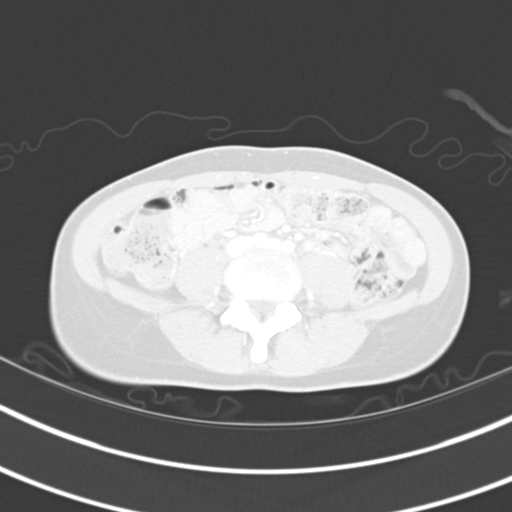
[im 51/82  soft-tissue]
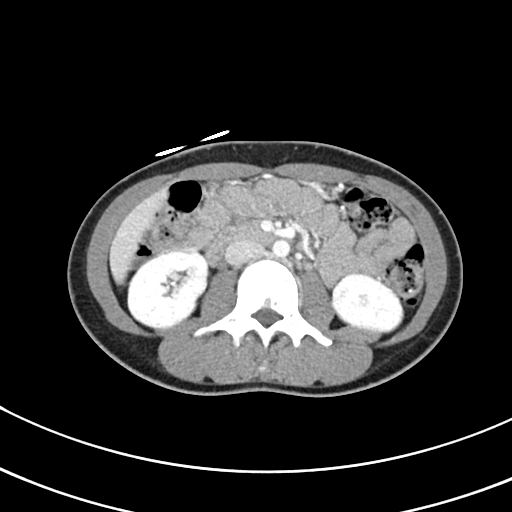
[im 51/82  lung]
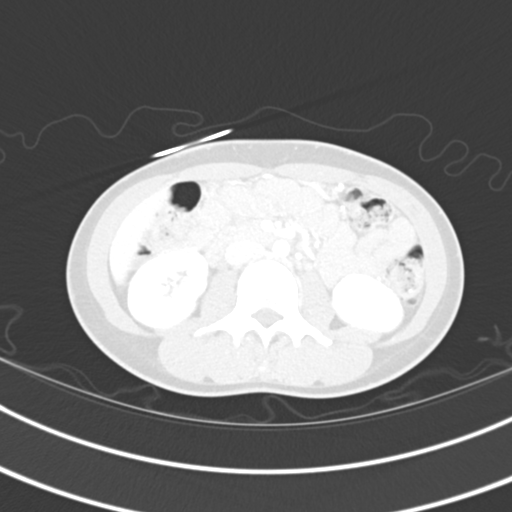
[im 61/82  soft-tissue]
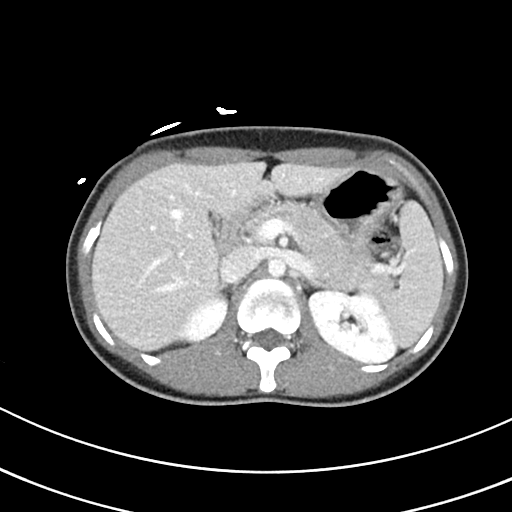
[im 61/82  lung]
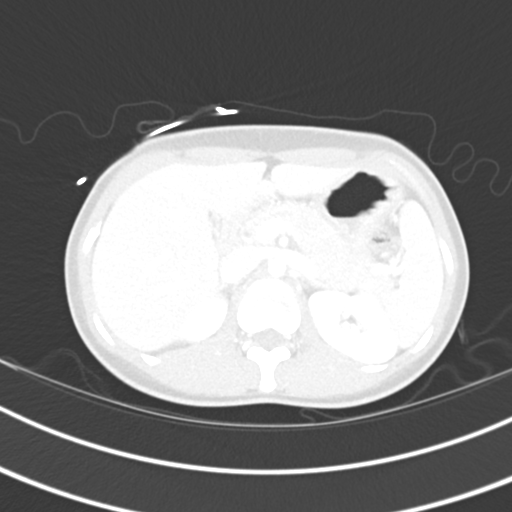
[im 71/82  soft-tissue]
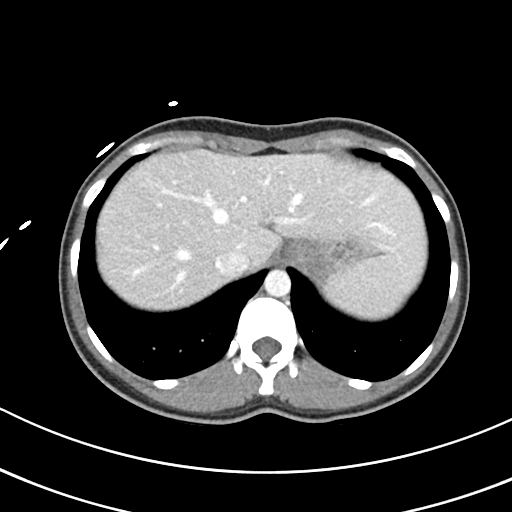
[im 71/82  lung]
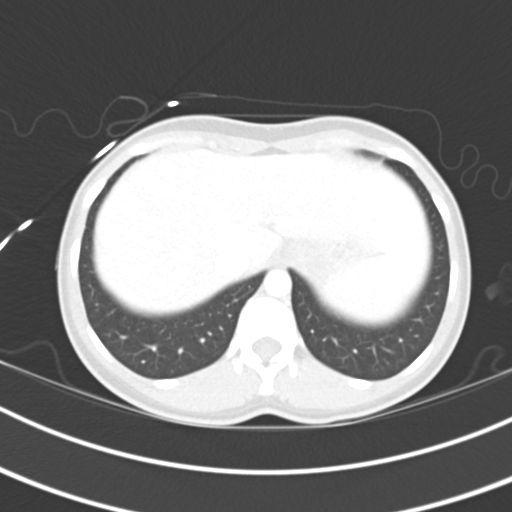

[Series 6: abdomen 3.0 mpr cor · coronal · 0.66mm/px · 3 of 63 slices shown]
[im 21/63  soft-tissue]
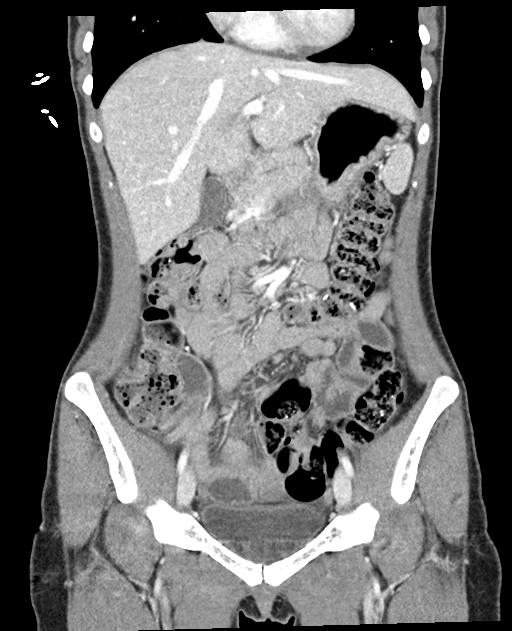
[im 28/63  soft-tissue]
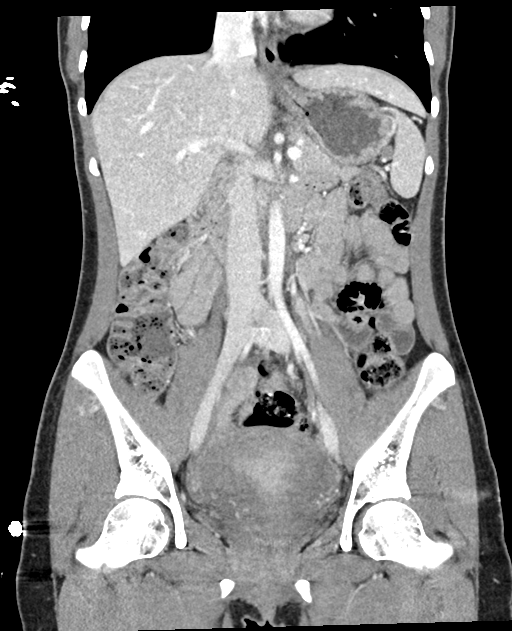
[im 35/63  soft-tissue]
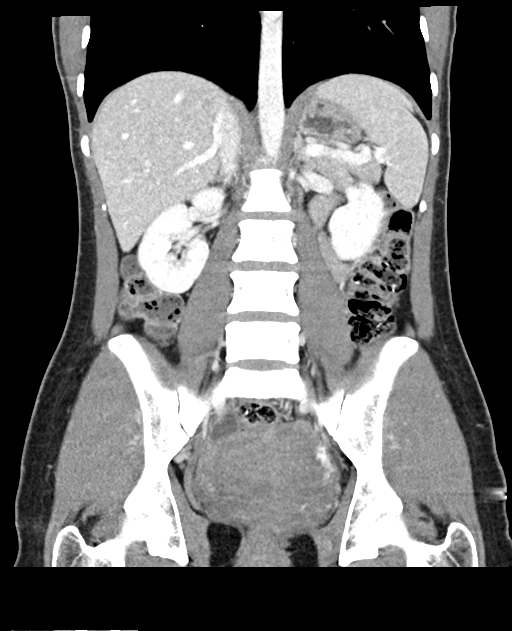

[Series 7: abdomen 3.0 mpr sag · sagittal · 0.37mm/px · 1 of 114 slices shown]
[im 38/114  soft-tissue]
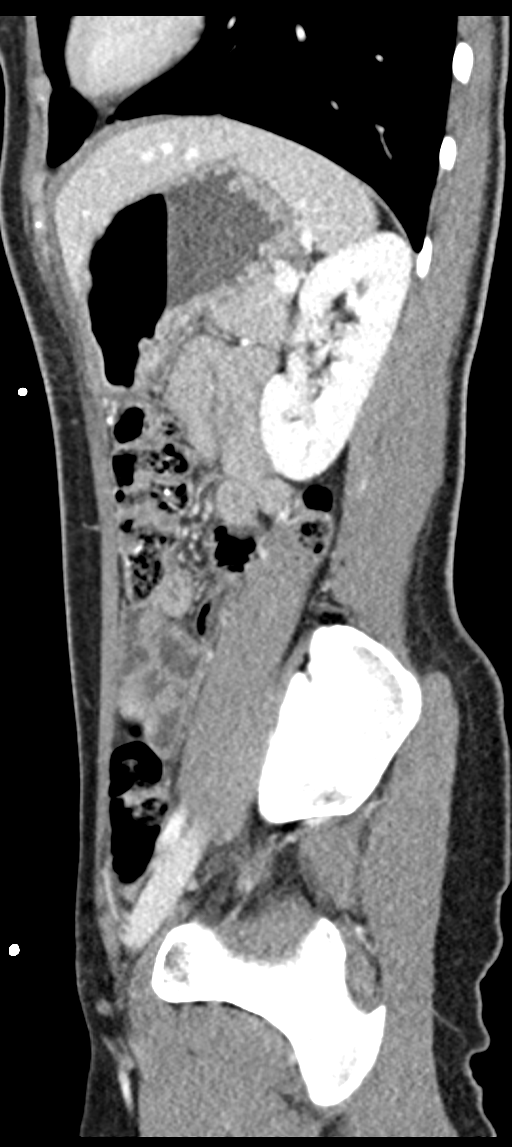

[11 of 46 positions shown; findings below may reference images not displayed]

FINDINGS: Lower chest: The visualized lung bases are clear bilaterally. The
visualized heart and pericardium are unremarkable.

Hepatobiliary: No focal liver abnormality is seen. No gallstones,
gallbladder wall thickening, or biliary dilatation.

Pancreas: Unremarkable. No pancreatic ductal dilatation or
surrounding inflammatory changes.

Spleen: Unremarkable

Adrenals/Urinary Tract: Adrenal glands are unremarkable. Kidneys are
normal, without renal calculi, focal lesion, or hydronephrosis.
Bladder is unremarkable.

Stomach/Bowel: The stomach, small bowel, and large bowel are
unremarkable. Appendix normal. No free intraperitoneal gas or fluid.

Vascular/Lymphatic: No significant vascular findings are present. No
enlarged abdominal or pelvic lymph nodes.

Reproductive: There is prominent endometrial enhancement as well as
a trace amount of fluid within the endometrial cavity. There is,
additionally, relative hypoenhancement of the myometrium which may
relate to edema. Together, these findings may relate to endometritis
in the setting of pelvic inflammatory disease. Mild haziness of the
pelvic fat may relate to inflammatory change. Corpus luteum noted
within the left ovary. No adnexal masses are seen. 2.7 cm simple
appearing cyst noted within the left ovary.

Other: No abdominal wall hernia.  Rectum unremarkable.

Musculoskeletal: No acute bone abnormality.
IMPRESSION: Prominent enhancement of the endometrium, trace fluid within the
endometrial cavity, and abnormal enhancement of the myometrium
possibly related to edema all suggestive of endometritis in the
setting of early pelvic inflammatory disease.

## 2022-08-15 ENCOUNTER — Encounter (HOSPITAL_COMMUNITY): Payer: Self-pay

## 2022-08-15 ENCOUNTER — Encounter (HOSPITAL_BASED_OUTPATIENT_CLINIC_OR_DEPARTMENT_OTHER): Payer: Self-pay | Admitting: Emergency Medicine

## 2022-08-15 ENCOUNTER — Emergency Department (HOSPITAL_BASED_OUTPATIENT_CLINIC_OR_DEPARTMENT_OTHER)
Admission: EM | Admit: 2022-08-15 | Discharge: 2022-08-15 | Disposition: A | Discharge status: Home / Self Care | Payer: Commercial Managed Care - HMO | Attending: Emergency Medicine | Admitting: Emergency Medicine

## 2022-08-15 ENCOUNTER — Emergency Department (HOSPITAL_BASED_OUTPATIENT_CLINIC_OR_DEPARTMENT_OTHER): Payer: Commercial Managed Care - HMO

## 2022-08-15 DIAGNOSIS — R1013 Epigastric pain: Secondary | ICD-10-CM | POA: Diagnosis present

## 2022-08-15 DIAGNOSIS — R1011 Right upper quadrant pain: Secondary | ICD-10-CM | POA: Insufficient documentation

## 2022-08-15 LAB — COMPREHENSIVE METABOLIC PANEL
ALT: 126 U/L — ABNORMAL HIGH (ref 0–44)
AST: 305 U/L — ABNORMAL HIGH (ref 15–41)
Albumin: 4 g/dL (ref 3.5–5.0)
Alkaline Phosphatase: 78 U/L (ref 38–126)
Anion gap: 10 (ref 5–15)
BUN: 12 mg/dL (ref 6–20)
CO2: 17 mmol/L — ABNORMAL LOW (ref 22–32)
Calcium: 8.1 mg/dL — ABNORMAL LOW (ref 8.9–10.3)
Chloride: 108 mmol/L (ref 98–111)
Creatinine, Ser: 0.69 mg/dL (ref 0.44–1.00)
GFR, Estimated: >60 mL/min (ref >60)
Glucose, Bld: 128 mg/dL — ABNORMAL HIGH (ref 70–99)
Potassium: 4.5 mmol/L (ref 3.5–5.1)
Sodium: 135 mmol/L (ref 135–145)
Total Bilirubin: 0.4 mg/dL (ref 0.3–1.2)
Total Protein: 6.7 g/dL (ref 6.5–8.1)

## 2022-08-15 LAB — CBC
HCT: 36.4 % (ref 36.0–46.0)
Hemoglobin: 11.5 g/dL — ABNORMAL LOW (ref 12.0–15.0)
MCH: 22.7 pg — ABNORMAL LOW (ref 26.0–34.0)
MCHC: 31.6 g/dL (ref 30.0–36.0)
MCV: 71.8 fL — ABNORMAL LOW (ref 80.0–100.0)
Platelets: 168 10*3/uL (ref 150–400)
RBC: 5.07 MIL/uL (ref 3.87–5.11)
RDW: 14.5 % (ref 11.5–15.5)
WBC: 11.8 10*3/uL — ABNORMAL HIGH (ref 4.0–10.5)
nRBC: 0 % (ref 0.0–0.2)

## 2022-08-15 LAB — URINALYSIS, ROUTINE W REFLEX MICROSCOPIC
Bacteria, UA: NONE SEEN
Bilirubin Urine: NEGATIVE
Glucose, UA: NEGATIVE mg/dL
Hgb urine dipstick: NEGATIVE
Ketones, ur: NEGATIVE mg/dL
Nitrite: NEGATIVE
Protein, ur: NEGATIVE mg/dL
Specific Gravity, Urine: 1.018 (ref 1.005–1.030)
pH: 5 (ref 5.0–8.0)

## 2022-08-15 LAB — PREGNANCY, URINE: Preg Test, Ur: NEGATIVE

## 2022-08-15 LAB — LIPASE, BLOOD: Lipase: 16 U/L (ref 11–51)

## 2022-08-15 MED ORDER — IOHEXOL 300 MG/ML  SOLN
100.0000 mL | Freq: Once | INTRAMUSCULAR | Status: AC | PRN
  Administered 2022-08-15: 60 mL via INTRAVENOUS

## 2022-08-15 MED ORDER — FENTANYL CITRATE PF 50 MCG/ML IJ SOSY
50.0000 ug | PREFILLED_SYRINGE | INTRAMUSCULAR | Status: DC | PRN
  Administered 2022-08-15: 50 ug via INTRAVENOUS
  Filled 2022-08-15: qty 1

## 2022-08-15 MED ORDER — ONDANSETRON 4 MG PO TBDP: ORAL_TABLET | ORAL | 0 refills | Status: AC

## 2022-08-15 MED ORDER — MORPHINE SULFATE 15 MG PO TABS: 7.5000 mg | ORAL_TABLET | ORAL | 0 refills | Status: AC | PRN

## 2022-08-15 NOTE — ED Triage Notes (Signed)
Severe abdominal pain. Upper abdo Started today Some diarrhea, denies n/v Constant sharp pain

## 2022-08-15 NOTE — ED Provider Notes (Signed)
[facility] EMERGENCY DEPT Provider Note   CSN: [id number] Arrival date & time: 08/15/22  1927     History  Chief Complaint  Patient presents with   Abdominal Pain    Wendy Gay is a 31 y.o. female.  31 yo F with a chief complaint of abdominal discomfort.  Worse in the epigastrium and right upper quadrant.  Going on since this morning.  Has had some nausea and diarrhea.  No fevers.   Abdominal Pain      Home Medications Prior to Admission medications   Medication Sig Start Date End Date Taking? Authorizing Provider  morphine (MSIR) 15 MG tablet Take 0.5 tablets (7.5 mg total) by mouth every 4 (four) hours as needed for severe pain. 08/15/22  Yes Melene Plan, DO  ondansetron (ZOFRAN-ODT) 4 MG disintegrating tablet 4mg  ODT q4 hours prn nausea/vomit 08/15/22  Yes 08/17/22, DO      Allergies    Patient has no known allergies.    Review of Systems   Review of Systems  Gastrointestinal:  Positive for abdominal pain.    Physical Exam Updated Vital Signs BP 108/69   Pulse 100   Temp 98.6 F (37 C) (Oral)   Resp 18   LMP 07/26/2022 (Approximate)   SpO2 100%  Physical Exam Vitals and nursing note reviewed.  Constitutional:      General: She is not in acute distress.    Appearance: She is well-developed. She is not diaphoretic.  HENT:     Head: Normocephalic and atraumatic.  Eyes:     Pupils: Pupils are equal, round, and reactive to light.  Cardiovascular:     Rate and Rhythm: Normal rate and regular rhythm.     Heart sounds: No murmur heard.    No friction rub. No gallop.  Pulmonary:     Effort: Pulmonary effort is normal.     Breath sounds: No wheezing or rales.  Abdominal:     General: There is no distension.     Palpations: Abdomen is soft.     Tenderness: There is abdominal tenderness.     Comments: Diffuse abdominal discomfort but worse in the epigastrium and right upper quadrant.  Negative Murphy sign.  Musculoskeletal:        General:  No tenderness.     Cervical back: Normal range of motion and neck supple.  Skin:    General: Skin is warm and dry.  Neurological:     Mental Status: She is alert and oriented to person, place, and time.  Psychiatric:        Behavior: Behavior normal.     ED Results / Procedures / Treatments   Labs (all labs ordered are listed, but only abnormal results are displayed) Labs Reviewed  URINALYSIS, ROUTINE W REFLEX MICROSCOPIC - Abnormal; Notable for the following components:      Result Value   Leukocytes,Ua TRACE (*)    All other components within normal limits  CBC - Abnormal; Notable for the following components:   WBC 11.8 (*)    Hemoglobin 11.5 (*)    MCV 71.8 (*)    MCH 22.7 (*)    All other components within normal limits  COMPREHENSIVE METABOLIC PANEL - Abnormal; Notable for the following components:   CO2 17 (*)    Glucose, Bld 128 (*)    Calcium 8.1 (*)    AST 305 (*)    ALT 126 (*)    All other components within normal limits  WET PREP,  GENITAL  PREGNANCY, URINE  LIPASE, BLOOD  HEPATITIS PANEL, ACUTE  GC/CHLAMYDIA PROBE AMP ([place]) NOT AT [facility]    EKG EKG Interpretation  Date/Time:  Thursday August 15 2022 21:22:41 EST Ventricular Rate:  112 PR Interval:  159 QRS Duration: 86 QT Interval:  325 QTC Calculation: 444 R Axis:   57 Text Interpretation: Sinus tachycardia Otherwise no significant change Confirmed by Melene Plan [phone]) on 08/15/2022 10:11:01 PM  Radiology CT ABDOMEN PELVIS W CONTRAST  Result Date: 08/15/2022 CLINICAL DATA:  Abdominal pain EXAM: CT ABDOMEN AND PELVIS WITH CONTRAST TECHNIQUE: Multidetector CT imaging of the abdomen and pelvis was performed using the standard protocol following bolus administration of intravenous contrast. RADIATION DOSE REDUCTION: This exam was performed according to the departmental dose-optimization program which includes automated exposure control, adjustment of the mA and/or kV according to patient size  and/or use of iterative reconstruction technique. CONTRAST:  25mL OMNIPAQUE IOHEXOL 300 MG/ML  SOLN COMPARISON:  10/24/2020 FINDINGS: Lower chest: Included lung bases are clear.  Heart size is normal. Hepatobiliary: No focal liver lesion is identified. Gallbladder is contracted. No hyperdense gallstone. Periportal edema. No biliary dilatation. Pancreas: Unremarkable. No pancreatic ductal dilatation or surrounding inflammatory changes. Spleen: Normal in size without focal abnormality. Adrenals/Urinary Tract: Unremarkable adrenal glands. Kidneys enhance symmetrically without focal lesion, stone, or hydronephrosis. Ureters are nondilated. Urinary bladder appears unremarkable for the degree of distention. Stomach/Bowel: Stomach is within normal limits. Appendix appears normal (series 5, image 42). No evidence of bowel wall thickening, distention, or inflammatory changes. Vascular/Lymphatic: No significant vascular findings are present. No enlarged abdominal or pelvic lymph nodes. Reproductive: Retroverted uterus. Heterogeneous enhancement of the myometrium with diffuse endometrial enhancement. 3.4 cm left adnexal cyst. No right adnexal abnormality. Other: Small volume free fluid within the pelvis. No organized fluid collection. No pneumoperitoneum. Musculoskeletal: No acute or significant osseous findings. IMPRESSION: 1. Heterogeneous appearance of the myometrium with diffuse endometrial enhancement. Findings are nonspecific but can be seen in the setting of endometritis/pelvic inflammatory disease. Consider pelvic ultrasound for further evaluation, as indicated by patient's symptoms. 2. Periportal edema, nonspecific but can be seen in the setting of acute hepatitis as well as fluid resuscitation. Correlate with liver function tests. 3. Small volume free fluid within the pelvis, which may be physiologic. Electronically Signed   By: Duanne Guess D.O.   On: 08/15/2022 21:55    Procedures Procedures     Medications Ordered in ED Medications  fentaNYL (SUBLIMAZE) injection 50 mcg (50 mcg Intravenous Given 08/15/22 2008)  iohexol (OMNIPAQUE) 300 MG/ML solution 100 mL (60 mLs Intravenous Contrast Given 08/15/22 2131)    ED Course/ Medical Decision Making/ A&P                           Medical Decision Making Amount and/or Complexity of Data Reviewed Labs: ordered. Radiology: ordered.  Risk Prescription drug management.   31 yo F with a chief complaints of abdominal pain.  This has been going on just today.  She is very uncomfortable on initial exam.  I was called to triage to take a look at her.  CT imaging ordered.  CT scan has resulted with concerns of endometrial inflammation.  There is also some concern about some edema around the liver.  Her LFTs are up mildly.  Will discuss with GI.  Discussed with Dr. Lorenso Quarry, based on my history and physical felt reasonable for outpatient follow-up.  She will try to arrange for the  office to give the patient a call.  Patient is feeling much better on repeat assessment.  Improved abdominal exam.  Tolerating p.o.  PCP and GI follow-up.  11:42 PM:  I have discussed the diagnosis/risks/treatment options with the patient.  Evaluation and diagnostic testing in the emergency department does not suggest an emergent condition requiring admission or immediate intervention beyond what has been performed at this time.  They will follow up with PCP. We also discussed returning to the ED immediately if new or worsening sx occur. We discussed the sx which are most concerning (e.g., sudden worsening pain, fever, inability to tolerate by mouth) that necessitate immediate return. Medications administered to the patient during their visit and any new prescriptions provided to the patient are listed below.  Medications given during this visit Medications  fentaNYL (SUBLIMAZE) injection 50 mcg (50 mcg Intravenous Given 08/15/22 2008)  iohexol (OMNIPAQUE) 300  MG/ML solution 100 mL (60 mLs Intravenous Contrast Given 08/15/22 2131)     The patient appears reasonably screen and/or stabilized for discharge and I doubt any other medical condition or other [facility] requiring further screening, evaluation, or treatment in the ED at this time prior to discharge.          Final Clinical Impression(s) / ED Diagnoses Final diagnoses:  Epigastric pain    Rx / DC Orders ED Discharge Orders          Ordered    morphine (MSIR) 15 MG tablet  Every 4 hours PRN        08/15/22 2337    ondansetron (ZOFRAN-ODT) 4 MG disintegrating tablet        08/15/22 2337              Melene Plan, DO 08/15/22 2342

## 2022-08-15 NOTE — Discharge Instructions (Addendum)
Please follow-up with a gastroenterologist in the office.  Should try to call you to set up an appointment.  If you have not heard anything by the end of the day tomorrow please give them a call.  Please return for worsening pain fever inability eat or drink.

## 2022-08-15 NOTE — ED Notes (Signed)
DC papers reviewed. No questions or concerns. No signs of distress. Pt assisted to wheelchair and out to lobby. Appropriate measures for safety taken. Translator used for [employer]. Family assists with getting to lobby.

## 2022-08-16 LAB — HEPATITIS PANEL, ACUTE
HCV Ab: NONREACTIVE
Hep A IgM: NONREACTIVE
Hep B C IgM: NONREACTIVE
Hepatitis B Surface Ag: NONREACTIVE

## 2022-10-03 ENCOUNTER — Other Ambulatory Visit: Payer: Self-pay | Admitting: Internal Medicine

## 2022-10-03 DIAGNOSIS — R7401 Elevation of levels of liver transaminase levels: Secondary | ICD-10-CM

## 2022-10-25 ENCOUNTER — Ambulatory Visit
Admission: RE | Admit: 2022-10-25 | Discharge: 2022-10-25 | Disposition: A | Discharge status: Home / Self Care | Payer: Commercial Managed Care - HMO | Source: Ambulatory Visit | Attending: Internal Medicine | Admitting: Internal Medicine

## 2022-10-25 DIAGNOSIS — R7401 Elevation of levels of liver transaminase levels: Secondary | ICD-10-CM

## 2022-11-26 ENCOUNTER — Emergency Department (HOSPITAL_BASED_OUTPATIENT_CLINIC_OR_DEPARTMENT_OTHER)
Admission: EM | Admit: 2022-11-26 | Discharge: 2022-11-26 | Disposition: A | Discharge status: Home / Self Care | Payer: Commercial Managed Care - HMO | Attending: Emergency Medicine | Admitting: Emergency Medicine

## 2022-11-26 ENCOUNTER — Encounter (HOSPITAL_BASED_OUTPATIENT_CLINIC_OR_DEPARTMENT_OTHER): Payer: Self-pay | Admitting: Emergency Medicine

## 2022-11-26 DIAGNOSIS — H5789 Other specified disorders of eye and adnexa: Secondary | ICD-10-CM | POA: Diagnosis not present

## 2022-11-26 DIAGNOSIS — R519 Headache, unspecified: Secondary | ICD-10-CM | POA: Diagnosis not present

## 2022-11-26 DIAGNOSIS — R220 Localized swelling, mass and lump, head: Secondary | ICD-10-CM | POA: Diagnosis present

## 2022-11-26 LAB — CBC WITH DIFFERENTIAL/PLATELET
Abs Immature Granulocytes: 0.01 10*3/uL (ref 0.00–0.07)
Basophils Absolute: 0 10*3/uL (ref 0.0–0.1)
Basophils Relative: 0 %
Eosinophils Absolute: 0.3 10*3/uL (ref 0.0–0.5)
Eosinophils Relative: 10 %
HCT: 36.4 % (ref 36.0–46.0)
Hemoglobin: 11.6 g/dL — ABNORMAL LOW (ref 12.0–15.0)
Immature Granulocytes: 0 %
Lymphocytes Relative: 28 %
Lymphs Abs: 0.8 10*3/uL (ref 0.7–4.0)
MCH: 22.4 pg — ABNORMAL LOW (ref 26.0–34.0)
MCHC: 31.9 g/dL (ref 30.0–36.0)
MCV: 70.4 fL — ABNORMAL LOW (ref 80.0–100.0)
Monocytes Absolute: 0.5 10*3/uL (ref 0.1–1.0)
Monocytes Relative: 17 %
Neutro Abs: 1.2 10*3/uL — ABNORMAL LOW (ref 1.7–7.7)
Neutrophils Relative %: 45 %
Platelets: 142 10*3/uL — ABNORMAL LOW (ref 150–400)
RBC: 5.17 MIL/uL — ABNORMAL HIGH (ref 3.87–5.11)
RDW: 14.7 % (ref 11.5–15.5)
WBC: 2.8 10*3/uL — ABNORMAL LOW (ref 4.0–10.5)
nRBC: 0 % (ref 0.0–0.2)

## 2022-11-26 LAB — C-REACTIVE PROTEIN: CRP: 0.8 mg/dL (ref <1.0)

## 2022-11-26 LAB — BASIC METABOLIC PANEL
Anion gap: 5 (ref 5–15)
BUN: 10 mg/dL (ref 6–20)
CO2: 26 mmol/L (ref 22–32)
Calcium: 8.7 mg/dL — ABNORMAL LOW (ref 8.9–10.3)
Chloride: 108 mmol/L (ref 98–111)
Creatinine, Ser: 0.64 mg/dL (ref 0.44–1.00)
GFR, Estimated: >60 mL/min (ref >60)
Glucose, Bld: 78 mg/dL (ref 70–99)
Potassium: 3.6 mmol/L (ref 3.5–5.1)
Sodium: 139 mmol/L (ref 135–145)

## 2022-11-26 LAB — SEDIMENTATION RATE: Sed Rate: 10 mm/hr (ref 0–22)

## 2022-11-26 MED ORDER — CEPHALEXIN 500 MG PO CAPS: 500.0000 mg | ORAL_CAPSULE | Freq: Two times a day (BID) | ORAL | 0 refills | Status: AC

## 2022-11-26 NOTE — ED Provider Notes (Signed)
[place] EMERGENCY DEPARTMENT AT [facility] Provider Note   CSN: [id number] Arrival date & time: 11/26/22  1505     History  Chief Complaint  Patient presents with   Facial Swelling    Wendy Gay is a 32 y.o. female.  32 year old female with no past medical history presents to the ED with a chief complaint of left eye pain and facial pain has been ongoing for the past 4 days.  Patient reports redness to her conjunctiva, left upper eyelid that is been ongoing for 4 days.  She reports taking some Tylenol to help with improvement in symptoms without much relief.  She reports a subjective fever, however never recorded her temperature but did take Tylenol.  She denies any medication changes, no changes to her daily routine.Left eye is more swollen than the right but she has normal EOMS. No vision changes, no nausea, no recorded fever.   The history is provided by the patient.       Home Medications Prior to Admission medications   Medication Sig Start Date End Date Taking? Authorizing Provider  cephALEXin (KEFLEX) 500 MG capsule Take 1 capsule (500 mg total) by mouth 2 (two) times daily for 7 days. 11/26/22 12/03/22 Yes [place] Sorn, Leonie Douglas, PA-C  morphine (MSIR) 15 MG tablet Take 0.5 tablets (7.5 mg total) by mouth every 4 (four) hours as needed for severe pain. 08/15/22   Melene Plan, DO  ondansetron (ZOFRAN-ODT) 4 MG disintegrating tablet 4mg  ODT q4 hours prn nausea/vomit 08/15/22   Melene Plan, DO      Allergies    Patient has no known allergies.    Review of Systems   Review of Systems  Constitutional:  Negative for chills and fever.  Eyes:  Positive for pain and itching. Negative for photophobia and discharge.    Physical Exam Updated Vital Signs BP 110/71 (BP Location: Right Arm)   Pulse 98   Temp 98.7 F (37.1 C)   Resp 12   Wt 49 kg   LMP 10/31/2022   SpO2 100%   BMI 19.75 kg/m  Physical Exam Vitals and nursing note reviewed.  Constitutional:      Appearance:  Normal appearance.  HENT:     Head: Normocephalic and atraumatic.     Mouth/Throat:     Mouth: Mucous membranes are moist.  Eyes:     Extraocular Movements: Extraocular movements intact.     Right eye: Normal extraocular motion and no nystagmus.     Left eye: Normal extraocular motion and no nystagmus.     Conjunctiva/sclera:     Right eye: Right conjunctiva is not injected. No exudate or hemorrhage.    Left eye: Left conjunctiva is injected. No exudate or hemorrhage.  Cardiovascular:     Rate and Rhythm: Normal rate.  Pulmonary:     Effort: Pulmonary effort is normal.  Abdominal:     General: Abdomen is flat.  Musculoskeletal:     Cervical back: Normal range of motion and neck supple.  Skin:    General: Skin is warm and dry.  Neurological:     Mental Status: She is alert and oriented to person, place, and time.     ED Results / Procedures / Treatments   Labs (all labs ordered are listed, but only abnormal results are displayed) Labs Reviewed  BASIC METABOLIC PANEL - Abnormal; Notable for the following components:      Result Value   Calcium 8.7 (*)    All other components within normal  limits  CBC WITH DIFFERENTIAL/PLATELET - Abnormal; Notable for the following components:   WBC 2.8 (*)    RBC 5.17 (*)    Hemoglobin 11.6 (*)    MCV 70.4 (*)    MCH 22.4 (*)    Platelets 142 (*)    Neutro Abs 1.2 (*)    All other components within normal limits  SEDIMENTATION RATE  C-REACTIVE PROTEIN    EKG None  Radiology No results found.  Procedures Procedures    Medications Ordered in ED Medications - No data to display  ED Course/ Medical Decision Making/ A&P                             Medical Decision Making Amount and/or Complexity of Data Reviewed Labs: ordered.  Risk Prescription drug management.   Patient presents to the ED with a chief complaint of left eye swelling has been ongoing for the past 4 days.  States also subjective fever which she took  some Tylenol for.  She reports no changes in her routine, no changes in soap, no changes in her make-up routine.  She arrived to the ED afebrile with stable vital signs.  On evaluation there is minimal swelling noted to the left upper eyelid, no visible drainage noted, no glands noted to be swollen or red.  She does have good EOMs on my exam.  No palpable pain along the lower eyelid, the left eye.  She does complain of pain to that left eye however no signs of a periorbital versus orbital cellulitis noted.  I discussed this case with my attending Dr. Rhunette CroftNanavati who also saw the patient, some concern for uveitis in the setting of a nervous system process.  Therefore ESR and CRP were checked.  ESR is negative on today's visit.  CBC remarkable for some leukopenia, EMP with no electrolyte derangement, creatinine levels unremarkable.  No drainage, no discharge from lateral eyes, I do not suspect infectious process at this time.  Patient does not have any primary care physician, will err on the side of treating for superimposed skin infection at this time with Keflex.  We also discussed Flonase to help open her sinuses.  Patient is agreeable to plan and treatment, strict return precautions provided at length.    Portions of this note were generated with Scientist, clinical (histocompatibility and immunogenetics)Dragon dictation software. Dictation errors may occur despite best attempts at proofreading.   Final Clinical Impression(s) / ED Diagnoses Final diagnoses:  Eye swelling, left    Rx / DC Orders ED Discharge Orders          Ordered    cephALEXin (KEFLEX) 500 MG capsule  2 times daily        11/26/22 1318              Claude MangesSoto, Berneta Sconyers, PA-C 11/26/22 1321    Derwood KaplanNanavati, Ankit, MD 12/03/22 0017

## 2022-11-26 NOTE — Discharge Instructions (Addendum)
Your laboratory results are within normal limits today.  I have prescribed antibiotics to help treat your infection, please take 1 tablet twice a day for the next 7 days.

## 2022-11-26 NOTE — ED Triage Notes (Signed)
Pt arrives pov, steady gait with c/o LT eye and facial pain and swelling x 3 days. Conjunctiva redness, swelling noted. Denies injury

## 2022-12-04 ENCOUNTER — Other Ambulatory Visit: Payer: Self-pay

## 2022-12-04 ENCOUNTER — Encounter (HOSPITAL_BASED_OUTPATIENT_CLINIC_OR_DEPARTMENT_OTHER): Payer: Self-pay | Admitting: Emergency Medicine

## 2022-12-04 ENCOUNTER — Other Ambulatory Visit (HOSPITAL_BASED_OUTPATIENT_CLINIC_OR_DEPARTMENT_OTHER): Payer: Self-pay

## 2022-12-04 ENCOUNTER — Emergency Department (HOSPITAL_BASED_OUTPATIENT_CLINIC_OR_DEPARTMENT_OTHER): Payer: Commercial Managed Care - HMO | Admitting: Radiology

## 2022-12-04 ENCOUNTER — Emergency Department (HOSPITAL_BASED_OUTPATIENT_CLINIC_OR_DEPARTMENT_OTHER)
Admission: EM | Admit: 2022-12-04 | Discharge: 2022-12-04 | Disposition: A | Discharge status: Home / Self Care | Payer: Commercial Managed Care - HMO | Attending: Emergency Medicine | Admitting: Emergency Medicine

## 2022-12-04 DIAGNOSIS — R519 Headache, unspecified: Secondary | ICD-10-CM

## 2022-12-04 DIAGNOSIS — R220 Localized swelling, mass and lump, head: Secondary | ICD-10-CM

## 2022-12-04 LAB — CBC WITH DIFFERENTIAL/PLATELET
Abs Immature Granulocytes: 0.01 10*3/uL (ref 0.00–0.07)
Basophils Absolute: 0.1 10*3/uL (ref 0.0–0.1)
Basophils Relative: 1 %
Eosinophils Absolute: 0 10*3/uL (ref 0.0–0.5)
Eosinophils Relative: 1 %
HCT: 29.3 % — ABNORMAL LOW (ref 36.0–46.0)
Hemoglobin: 9.6 g/dL — ABNORMAL LOW (ref 12.0–15.0)
Immature Granulocytes: 0 %
Lymphocytes Relative: 16 %
Lymphs Abs: 0.9 10*3/uL (ref 0.7–4.0)
MCH: 22.6 pg — ABNORMAL LOW (ref 26.0–34.0)
MCHC: 32.8 g/dL (ref 30.0–36.0)
MCV: 68.9 fL — ABNORMAL LOW (ref 80.0–100.0)
Monocytes Absolute: 0.4 10*3/uL (ref 0.1–1.0)
Monocytes Relative: 7 %
Neutro Abs: 4.1 10*3/uL (ref 1.7–7.7)
Neutrophils Relative %: 75 %
Platelets: 254 10*3/uL (ref 150–400)
RBC: 4.25 MIL/uL (ref 3.87–5.11)
RDW: 14.6 % (ref 11.5–15.5)
WBC: 5.4 10*3/uL (ref 4.0–10.5)
nRBC: 0 % (ref 0.0–0.2)

## 2022-12-04 LAB — COMPREHENSIVE METABOLIC PANEL
ALT: 145 U/L — ABNORMAL HIGH (ref 0–44)
AST: 69 U/L — ABNORMAL HIGH (ref 15–41)
Albumin: 3.3 g/dL — ABNORMAL LOW (ref 3.5–5.0)
Alkaline Phosphatase: 61 U/L (ref 38–126)
Anion gap: 4 — ABNORMAL LOW (ref 5–15)
BUN: 20 mg/dL (ref 6–20)
CO2: 26 mmol/L (ref 22–32)
Calcium: 8.5 mg/dL — ABNORMAL LOW (ref 8.9–10.3)
Chloride: 110 mmol/L (ref 98–111)
Creatinine, Ser: 0.9 mg/dL (ref 0.44–1.00)
GFR, Estimated: >60 mL/min (ref >60)
Glucose, Bld: 103 mg/dL — ABNORMAL HIGH (ref 70–99)
Potassium: 4.5 mmol/L (ref 3.5–5.1)
Sodium: 140 mmol/L (ref 135–145)
Total Bilirubin: 0.5 mg/dL (ref 0.3–1.2)
Total Protein: 6.1 g/dL — ABNORMAL LOW (ref 6.5–8.1)

## 2022-12-04 LAB — HCG, SERUM, QUALITATIVE: Preg, Serum: NEGATIVE

## 2022-12-04 MED ORDER — SODIUM CHLORIDE 0.9 % IV BOLUS
500.0000 mL | Freq: Once | INTRAVENOUS | Status: AC
  Administered 2022-12-04: 500 mL via INTRAVENOUS

## 2022-12-04 MED ORDER — DIPHENHYDRAMINE HCL 25 MG PO TABS
25.0000 mg | ORAL_TABLET | Freq: Four times a day (QID) | ORAL | 0 refills | Status: AC
  Filled 2022-12-04: qty 20, 5d supply, fill #0

## 2022-12-04 MED ORDER — METOCLOPRAMIDE HCL 5 MG/ML IJ SOLN
10.0000 mg | Freq: Once | INTRAMUSCULAR | Status: AC
  Administered 2022-12-04: 10 mg via INTRAVENOUS
  Filled 2022-12-04: qty 2

## 2022-12-04 MED ORDER — DIPHENHYDRAMINE HCL 50 MG/ML IJ SOLN
25.0000 mg | Freq: Once | INTRAMUSCULAR | Status: AC
  Administered 2022-12-04: 25 mg via INTRAVENOUS
  Filled 2022-12-04: qty 1

## 2022-12-04 MED ORDER — KETOROLAC TROMETHAMINE 15 MG/ML IJ SOLN
15.0000 mg | Freq: Once | INTRAMUSCULAR | Status: AC
  Administered 2022-12-04: 15 mg via INTRAVENOUS
  Filled 2022-12-04: qty 1

## 2022-12-04 MED ORDER — PREDNISONE 10 MG PO TABS
ORAL_TABLET | Freq: Every day | ORAL | 0 refills | Status: AC
  Filled 2022-12-04: qty 42, 12d supply, fill #0

## 2022-12-04 NOTE — ED Provider Notes (Signed)
I provided a substantive portion of the care of this patient.  I personally made/approved the management plan for this patient and take responsibility for the patient management.     Reports for about a week she is swelling of the left eyelid and around the eye and her feet.  She denies any pain at this time.  She denies any exposure to new chemicals however she works in a [employer].  She reports sometime over a week ago she seemed to have possible viral illness but although symptoms resolved.  She denies ever had any sore throat.  Patient is alert and nontoxic.  Swelling is mild and objectively difficult to appreciate.  I palpated all around the orbital ridges and soft tissues she is nontender there is no crepitus nasal passages and oral cavity are normal in appearance without any lesions.  Neck is supple without lymphadenopathy.  It is regular no rub murmur gallop.  Lungs are clear to auscultation.  Patient does not have peripheral edema.  She reports sometimes she notes some swelling around her ankles or feet but at this time there is no pitting edema present and no obvious joint effusions.  At this time I question allergic response.  Patient does work in environment that could have multiple possible allergens.  She describes the symptoms as waxing and waning to an extent.  At this time with no objective signs of facial infection or systemic infection, will opt to trial of Medrol Dosepak.  I have told the patient she needs a recheck likely within the next few days.   Arby Barrette, MD 12/04/22 1254

## 2022-12-04 NOTE — Discharge Instructions (Signed)
Please take your medications as prescribed. Take tylenol/ibuprofen for pain. I recommend close follow-up with PCP for reevaluation.  Please do not hesitate to return to emergency department if worrisome signs symptoms we discussed become apparent.  

## 2022-12-04 NOTE — ED Provider Notes (Signed)
[place] EMERGENCY DEPARTMENT AT [facility] Provider Note   CSN: [id number] Arrival date & time: 12/04/22  1033     History  Chief Complaint  Patient presents with   Facial Swelling    Wendy Gay is a 32 y.o. female with no significant past medical history presents today for evaluation of headache, facial swelling, leg swelling.  Patient was evaluated in the ED a week ago with facial swelling and eye redness.  She was given Keflex to treat skin infection.  She reports no improvement.  She went back to [facility] on Saturday, was given meloxicam for swelling and pain but also reports no improvement.  Patient returned to our emergency department today with increased facial swelling and pain in her legs.  Patient has been a nail technician for 4 years and reports no allergy to any of the nail products.  She endorses cough and shortness of breath.  History of migraines.  Today she reports pain in the back of her head radiating down to her neck and right shoulder.  States the pain make it difficult for her to sleep.  Has not tried a medication for pain at home. Per chart review, labs drawn on 11/26/2022 with negative CRP and sed rate.  HPI    Past Medical History:  Diagnosis Date   Medical history non-contributory    Past Surgical History:  Procedure Laterality Date   NO PAST SURGERIES       Home Medications Prior to Admission medications   Medication Sig Start Date End Date Taking? Authorizing Provider  morphine (MSIR) 15 MG tablet Take 0.5 tablets (7.5 mg total) by mouth every 4 (four) hours as needed for severe pain. 08/15/22   Melene Plan, DO  ondansetron (ZOFRAN-ODT) 4 MG disintegrating tablet  ODT q4 hours prn nausea/vomit 08/15/22   Melene Plan, DO      Allergies    Patient has no known allergies.    Review of Systems   Review of Systems Negative except as per HPI.  Physical Exam Updated Vital Signs BP 121/83   Pulse 85   Temp 99.5 F (37.5 C) (Oral)    Resp 19   LMP 10/31/2022   SpO2 100%  Physical Exam Vitals and nursing note reviewed.  Constitutional:      Appearance: Normal appearance.  HENT:     Head: Normocephalic and atraumatic.     Mouth/Throat:     Mouth: Mucous membranes are moist.  Eyes:     General: No scleral icterus. Cardiovascular:     Rate and Rhythm: Normal rate and regular rhythm.     Pulses: Normal pulses.     Heart sounds: Normal heart sounds.  Pulmonary:     Effort: Pulmonary effort is normal.     Breath sounds: Normal breath sounds.  Abdominal:     General: Abdomen is flat.     Palpations: Abdomen is soft.     Tenderness: There is no abdominal tenderness.  Musculoskeletal:        General: No deformity.  Skin:    General: Skin is warm.     Findings: No rash.  Neurological:     General: No focal deficit present.     Mental Status: She is alert.  Psychiatric:        Mood and Affect: Mood normal.     ED Results / Procedures / Treatments   Labs (all labs ordered are listed, but only abnormal results are displayed) Labs Reviewed - No data  to display  EKG None  Radiology No results found.  Procedures Procedures    Medications Ordered in ED Medications  sodium chloride 0.9 % bolus 500 mL (has no administration in time range)  ketorolac (TORADOL) 15 MG/ML injection 15 mg (has no administration in time range)  metoCLOPramide (REGLAN) injection 10 mg (has no administration in time range)  diphenhydrAMINE (BENADRYL) injection 25 mg (has no administration in time range)    ED Course/ Medical Decision Making/ A&P                             Medical Decision Making Amount and/or Complexity of Data Reviewed Labs: ordered. Radiology: ordered.  Risk OTC drugs. Prescription drug management.   This patient presents to the ED for facial swelling, extremity swelling, this involves an extensive number of treatment options, and is a complaint that carries with a high risk of complications and  morbidity.  The differential diagnosis includes allergic reaction, anaphylaxis, cellulitis, anemia, electrolyte abnormalities, infectious etiology, viral illness.  This is not an exhaustive list.  Lab tests: I ordered and personally interpreted labs.  The pertinent results include: WBC unremarkable. Hbg 9.6. Platelets unremarkable. Electrolytes unremarkable. BUN, creatinine unremarkable.   Imaging studies: I ordered imaging studies. I personally reviewed, interpreted imaging and agree with the radiologist's interpretations. The results include: Chest x-ray unremarkable.  Problem list/ ED course/ Critical interventions/ Medical management: HPI: See above Vital signs within normal range and stable throughout visit. Laboratory/imaging studies significant for: See above. On physical examination, patient is afebrile and appears in no acute distress. This patient presents with symptoms consistent with likely acute allergic reaction vs viral illness. Presentation not consistent with acute anaphylaxis (lack of pulmonary, dermatologic, cardiovascular or GI symptoms, lack of hypotension or exposure to known allergen), angioedema, serum sickness (no recent drug exposure, lacks fevers, arthralgias). No evidence of airway compromise or shock at this time.  Patient is alert and nontoxic at this point.  Swelling is mild.  Headache improved with migraine cocktail.  We will trial a short course of steroid and Benadryl.  Advised patient to follow-up with PCP for further evaluation and management.  Strict return precaution given. I have reviewed the patient home medicines and have made adjustments as needed.  Cardiac monitoring/EKG: The patient was maintained on a cardiac monitor.  I personally reviewed and interpreted the cardiac monitor which showed an underlying rhythm of: sinus rhythm.  Additional history obtained: External records from outside source obtained and reviewed including: Chart review including  previous notes, labs, imaging.  Consultations obtained:  Disposition Continued outpatient therapy. Follow-up with PCP recommended for reevaluation of symptoms. Treatment plan discussed with patient.  Pt acknowledged understanding was agreeable to the plan. Worrisome signs and symptoms were discussed with patient, and patient acknowledged understanding to return to the ED if they noticed these signs and symptoms. Patient was stable upon discharge.   This chart was dictated using voice recognition software.  Despite best efforts to proofread,  errors can occur which can change the documentation meaning.          Final Clinical Impression(s) / ED Diagnoses Final diagnoses:  Facial swelling  Acute nonintractable headache, unspecified headache type    Rx / DC Orders ED Discharge Orders          Ordered    diphenhydrAMINE (BENADRYL) 25 MG tablet  Every 6 hours        12/04/22 1305    predniSONE (  DELTASONE) 10 MG tablet  Daily        12/04/22 1305              Jeanelle Malling, PA 12/04/22 1322    Arby Barrette, MD 12/05/22 [phone]

## 2022-12-04 NOTE — ED Notes (Signed)
Discharge paperwork given and verbally understood. 

## 2022-12-04 NOTE — ED Triage Notes (Signed)
Last week here for facial swelling, did not get better and now swollen down to her legs and pain from neck down to legs.

## 2023-02-17 DIAGNOSIS — Z419 Encounter for procedure for purposes other than remedying health state, unspecified: Secondary | ICD-10-CM | POA: Diagnosis not present

## 2023-03-20 DIAGNOSIS — Z419 Encounter for procedure for purposes other than remedying health state, unspecified: Secondary | ICD-10-CM | POA: Diagnosis not present

## 2023-04-15 DIAGNOSIS — R220 Localized swelling, mass and lump, head: Secondary | ICD-10-CM | POA: Diagnosis not present

## 2023-04-15 DIAGNOSIS — L299 Pruritus, unspecified: Secondary | ICD-10-CM | POA: Diagnosis not present

## 2023-04-15 DIAGNOSIS — R7989 Other specified abnormal findings of blood chemistry: Secondary | ICD-10-CM | POA: Diagnosis not present

## 2023-04-20 DIAGNOSIS — Z419 Encounter for procedure for purposes other than remedying health state, unspecified: Secondary | ICD-10-CM | POA: Diagnosis not present

## 2023-04-29 DIAGNOSIS — L299 Pruritus, unspecified: Secondary | ICD-10-CM | POA: Diagnosis not present

## 2023-04-29 DIAGNOSIS — Z23 Encounter for immunization: Secondary | ICD-10-CM | POA: Diagnosis not present

## 2023-04-29 DIAGNOSIS — R220 Localized swelling, mass and lump, head: Secondary | ICD-10-CM | POA: Diagnosis not present

## 2023-04-29 DIAGNOSIS — R7989 Other specified abnormal findings of blood chemistry: Secondary | ICD-10-CM | POA: Diagnosis not present

## 2023-04-29 DIAGNOSIS — L298 Other pruritus: Secondary | ICD-10-CM | POA: Diagnosis not present

## 2023-05-20 DIAGNOSIS — Z419 Encounter for procedure for purposes other than remedying health state, unspecified: Secondary | ICD-10-CM | POA: Diagnosis not present

## 2023-06-20 DIAGNOSIS — Z419 Encounter for procedure for purposes other than remedying health state, unspecified: Secondary | ICD-10-CM | POA: Diagnosis not present

## 2023-07-20 DIAGNOSIS — Z419 Encounter for procedure for purposes other than remedying health state, unspecified: Secondary | ICD-10-CM | POA: Diagnosis not present

## 2023-08-20 DIAGNOSIS — Z419 Encounter for procedure for purposes other than remedying health state, unspecified: Secondary | ICD-10-CM | POA: Diagnosis not present

## 2023-09-03 DIAGNOSIS — Z1331 Encounter for screening for depression: Secondary | ICD-10-CM | POA: Diagnosis not present

## 2023-09-03 DIAGNOSIS — R768 Other specified abnormal immunological findings in serum: Secondary | ICD-10-CM | POA: Diagnosis not present

## 2023-09-03 DIAGNOSIS — Z114 Encounter for screening for human immunodeficiency virus [HIV]: Secondary | ICD-10-CM | POA: Diagnosis not present

## 2023-09-03 DIAGNOSIS — Z0000 Encounter for general adult medical examination without abnormal findings: Secondary | ICD-10-CM | POA: Diagnosis not present

## 2023-09-03 DIAGNOSIS — D509 Iron deficiency anemia, unspecified: Secondary | ICD-10-CM | POA: Diagnosis not present

## 2023-09-03 DIAGNOSIS — L700 Acne vulgaris: Secondary | ICD-10-CM | POA: Diagnosis not present

## 2023-09-03 DIAGNOSIS — Z1330 Encounter for screening examination for mental health and behavioral disorders, unspecified: Secondary | ICD-10-CM | POA: Diagnosis not present

## 2023-09-03 DIAGNOSIS — Z13220 Encounter for screening for lipoid disorders: Secondary | ICD-10-CM | POA: Diagnosis not present

## 2023-09-03 DIAGNOSIS — Z0001 Encounter for general adult medical examination with abnormal findings: Secondary | ICD-10-CM | POA: Diagnosis not present

## 2023-09-18 DIAGNOSIS — R609 Edema, unspecified: Secondary | ICD-10-CM | POA: Diagnosis not present

## 2023-09-20 DIAGNOSIS — Z419 Encounter for procedure for purposes other than remedying health state, unspecified: Secondary | ICD-10-CM | POA: Diagnosis not present

## 2023-10-18 DIAGNOSIS — Z419 Encounter for procedure for purposes other than remedying health state, unspecified: Secondary | ICD-10-CM | POA: Diagnosis not present

## 2023-11-03 DIAGNOSIS — R768 Other specified abnormal immunological findings in serum: Secondary | ICD-10-CM | POA: Diagnosis not present

## 2023-11-03 DIAGNOSIS — D509 Iron deficiency anemia, unspecified: Secondary | ICD-10-CM | POA: Diagnosis not present

## 2023-11-29 DIAGNOSIS — Z419 Encounter for procedure for purposes other than remedying health state, unspecified: Secondary | ICD-10-CM | POA: Diagnosis not present

## 2023-12-10 DIAGNOSIS — R768 Other specified abnormal immunological findings in serum: Secondary | ICD-10-CM | POA: Diagnosis not present

## 2023-12-10 DIAGNOSIS — D509 Iron deficiency anemia, unspecified: Secondary | ICD-10-CM | POA: Diagnosis not present

## 2023-12-10 DIAGNOSIS — M329 Systemic lupus erythematosus, unspecified: Secondary | ICD-10-CM | POA: Diagnosis not present

## 2023-12-29 DIAGNOSIS — Z419 Encounter for procedure for purposes other than remedying health state, unspecified: Secondary | ICD-10-CM | POA: Diagnosis not present

## 2024-01-29 DIAGNOSIS — Z419 Encounter for procedure for purposes other than remedying health state, unspecified: Secondary | ICD-10-CM | POA: Diagnosis not present

## 2024-02-28 DIAGNOSIS — Z419 Encounter for procedure for purposes other than remedying health state, unspecified: Secondary | ICD-10-CM | POA: Diagnosis not present

## 2024-03-30 DIAGNOSIS — Z419 Encounter for procedure for purposes other than remedying health state, unspecified: Secondary | ICD-10-CM | POA: Diagnosis not present

## 2024-04-20 DIAGNOSIS — R768 Other specified abnormal immunological findings in serum: Secondary | ICD-10-CM | POA: Diagnosis not present

## 2024-04-20 DIAGNOSIS — L700 Acne vulgaris: Secondary | ICD-10-CM | POA: Diagnosis not present

## 2024-04-20 DIAGNOSIS — D509 Iron deficiency anemia, unspecified: Secondary | ICD-10-CM | POA: Diagnosis not present

## 2024-04-30 DIAGNOSIS — Z419 Encounter for procedure for purposes other than remedying health state, unspecified: Secondary | ICD-10-CM | POA: Diagnosis not present

## 2024-05-19 DIAGNOSIS — Z23 Encounter for immunization: Secondary | ICD-10-CM | POA: Diagnosis not present

## 2024-05-19 DIAGNOSIS — R399 Unspecified symptoms and signs involving the genitourinary system: Secondary | ICD-10-CM | POA: Diagnosis not present

## 2024-05-19 DIAGNOSIS — R300 Dysuria: Secondary | ICD-10-CM | POA: Diagnosis not present

## 2024-05-26 DIAGNOSIS — Z1331 Encounter for screening for depression: Secondary | ICD-10-CM | POA: Diagnosis not present

## 2024-05-26 DIAGNOSIS — M329 Systemic lupus erythematosus, unspecified: Secondary | ICD-10-CM | POA: Diagnosis not present

## 2024-05-30 DIAGNOSIS — Z419 Encounter for procedure for purposes other than remedying health state, unspecified: Secondary | ICD-10-CM | POA: Diagnosis not present

## 2024-08-12 ENCOUNTER — Other Ambulatory Visit: Payer: Self-pay

## 2024-08-12 ENCOUNTER — Emergency Department (HOSPITAL_BASED_OUTPATIENT_CLINIC_OR_DEPARTMENT_OTHER)
Admission: EM | Admit: 2024-08-12 | Discharge: 2024-08-12 | Disposition: A | Discharge status: Home / Self Care | Attending: Emergency Medicine | Admitting: Emergency Medicine

## 2024-08-12 DIAGNOSIS — J101 Influenza due to other identified influenza virus with other respiratory manifestations: Secondary | ICD-10-CM | POA: Diagnosis not present

## 2024-08-12 DIAGNOSIS — R509 Fever, unspecified: Secondary | ICD-10-CM | POA: Diagnosis present

## 2024-08-12 LAB — RESP PANEL BY RT-PCR (RSV, FLU A&B, COVID)  RVPGX2
Influenza A by PCR: POSITIVE — AB
Influenza B by PCR: NEGATIVE
Resp Syncytial Virus by PCR: NEGATIVE
SARS Coronavirus 2 by RT PCR: NEGATIVE

## 2024-08-12 NOTE — ED Provider Notes (Signed)
 " [place] EMERGENCY DEPARTMENT AT [facility] Provider Note   CSN: [id number] Arrival date & time: 08/12/24  1325     Patient presents with: Cough and Fever   Wendy Gay is a 33 y.o. female.  Patient reports that she has had fever since Sunday with headache and congestion.  Patient reports she is been taking Tylenol  and Tamiflu intermittently throughout the last couple days and reports she is still having a fever.  Patient reports she has been able to drink plenty fluids and denies any nausea or vomiting.  Patient denies any shortness of breath.      Prior to Admission medications  Medication Sig Start Date End Date Taking? Authorizing Provider  diphenhydrAMINE  (BENADRYL ) 25 MG tablet Take 1 tablet (25 mg total) by mouth every 6 (six) hours. 12/04/22   Ladora Congress, PA  morphine  (MSIR) 15 MG tablet Take 0.5 tablets (7.5 mg total) by mouth every 4 (four) hours as needed for severe pain. 08/15/22   Emil Share, DO  ondansetron  (ZOFRAN -ODT) 4 MG disintegrating tablet 4mg  ODT q4 hours prn nausea/vomit 08/15/22   Floyd, Dan, DO  predniSONE  (DELTASONE ) 10 MG tablet Take as directed on attached sheet. 12/04/22   Ladora Congress, PA    Allergies: Patient has no known allergies.    Review of Systems  Constitutional:  Positive for fever.  Respiratory:  Positive for cough.     Updated Vital Signs BP 94/77   Pulse 94   Temp 98.6 F (37 C) (Oral)   Resp 16   Ht 5' 2 (1.575 m)   Wt 49 kg   SpO2 100%   BMI 19.76 kg/m   Physical Exam Vitals and nursing note reviewed.  Constitutional:      General: She is not in acute distress.    Appearance: Normal appearance. She is not ill-appearing.  HENT:     Head: Normocephalic and atraumatic.     Nose: Nose normal.     Mouth/Throat:     Mouth: Mucous membranes are moist.     Pharynx: No oropharyngeal exudate or posterior oropharyngeal erythema.  Eyes:     Extraocular Movements: Extraocular movements intact.     Conjunctiva/sclera:  Conjunctivae normal.     Pupils: Pupils are equal, round, and reactive to light.  Cardiovascular:     Rate and Rhythm: Normal rate.  Pulmonary:     Effort: Pulmonary effort is normal. No respiratory distress.     Breath sounds: Normal breath sounds.     Comments: Clear in all fields to auscultation Abdominal:     General: Abdomen is flat. There is no distension.     Tenderness: There is no abdominal tenderness.  Musculoskeletal:        General: Normal range of motion.     Cervical back: Normal range of motion.  Skin:    General: Skin is warm.     Capillary Refill: Capillary refill takes less than 2 seconds.  Neurological:     General: No focal deficit present.     Mental Status: She is alert.  Psychiatric:        Mood and Affect: Mood normal.        Behavior: Behavior normal.     (all labs ordered are listed, but only abnormal results are displayed) Labs Reviewed  RESP PANEL BY RT-PCR (RSV, FLU A&B, COVID)  RVPGX2 - Abnormal; Notable for the following components:      Result Value   Influenza A by PCR POSITIVE (*)  All other components within normal limits    EKG: None  Radiology: No results found.   Procedures   Medications Ordered in the ED - No data to display  33 y.o. female presents to the ED with complaints of fever, cough, congestion, The differential diagnosis includes viral illness, pneumonia, sinusitis, (Ddx)  On arrival pt is nontoxic, vitals unremarkable. Exam significant for mild nasal congestion   Lab Tests:  Respiratory panel ordered in triage significant for flu A  ED Course:   Lungs are clear to auscultation in all fields and no increased work of breathing patient is in no acute distress nontoxic-appearing.  Patient reports she is able to tolerate fluids and has increased p.o. for oral intake.  Patient denies any nausea or vomiting.  Patient symptoms were consistent with viral illness and she was positive for flu A in ED.  Low concern for  pneumonia at this time.  Symptoms have only been present for 4 days.  Patient was advised to continue to take Tylenol  and flu medication at home and patient was advised of strict return precautions for worsening symptoms.  Patient agreed to treatment plan at this time is comfortable discharge.  Portions of this note were generated with Scientist, clinical (histocompatibility and immunogenetics). Dictation errors may occur despite best attempts at proofreading.   Final diagnoses:  Influenza A    ED Discharge Orders     None          Myriam Fonda GORMAN DEVONNA 08/12/24 1555    Armenta Canning, MD 08/12/24 2325  "

## 2024-08-12 NOTE — Discharge Instructions (Signed)
 You are positive for flu A in the emergency department today.  Your exam was reassuring today.  Please continue to take Tylenol  and ibuprofen  at home OR severe cold and flu medication.  Drink plenty of fluids and eat as tolerated.  If you experience any concerning new or worsening symptoms such as shortness of breath or inability to tolerate fluids please return to the ED for further evaluation.

## 2024-08-12 NOTE — ED Triage Notes (Signed)
 Started tamiflu 2 days ago. Given from coworker. Tylenol  @ 10 am. Fever and cough since Sunday night
